# Patient Record
Sex: Female | Born: 1970
Health system: Southern US, Community
[De-identification: ages and names within clinical notes are randomized; demographics above are authoritative.]

## PROBLEM LIST (undated history)

## (undated) DIAGNOSIS — F419 Anxiety disorder, unspecified: Secondary | ICD-10-CM

## (undated) DIAGNOSIS — J310 Chronic rhinitis: Secondary | ICD-10-CM

## (undated) DIAGNOSIS — I10 Essential (primary) hypertension: Secondary | ICD-10-CM

## (undated) DIAGNOSIS — E785 Hyperlipidemia, unspecified: Secondary | ICD-10-CM

## (undated) DIAGNOSIS — R310 Gross hematuria: Secondary | ICD-10-CM

## (undated) DIAGNOSIS — J449 Chronic obstructive pulmonary disease, unspecified: Secondary | ICD-10-CM

## (undated) DIAGNOSIS — IMO0001 Reserved for inherently not codable concepts without codable children: Secondary | ICD-10-CM

## (undated) DIAGNOSIS — J45909 Unspecified asthma, uncomplicated: Secondary | ICD-10-CM

## (undated) DIAGNOSIS — N200 Calculus of kidney: Secondary | ICD-10-CM

## (undated) DIAGNOSIS — I359 Nonrheumatic aortic valve disorder, unspecified: Secondary | ICD-10-CM

## (undated) DIAGNOSIS — K219 Gastro-esophageal reflux disease without esophagitis: Secondary | ICD-10-CM

## (undated) DIAGNOSIS — N390 Urinary tract infection, site not specified: Secondary | ICD-10-CM

## (undated) DIAGNOSIS — M6110 Myositis ossificans progressiva, unspecified site: Secondary | ICD-10-CM

## (undated) DIAGNOSIS — R300 Dysuria: Secondary | ICD-10-CM

## (undated) DIAGNOSIS — G43909 Migraine, unspecified, not intractable, without status migrainosus: Secondary | ICD-10-CM

## (undated) DIAGNOSIS — D649 Anemia, unspecified: Secondary | ICD-10-CM

## (undated) HISTORY — DX: Calculus of kidney: N20.0

## (undated) HISTORY — DX: Migraine, unspecified, not intractable, without status migrainosus: G43.909

## (undated) HISTORY — DX: Reserved for inherently not codable concepts without codable children: IMO0001

## (undated) HISTORY — DX: Gross hematuria: R31.0

## (undated) HISTORY — PX: ABDOMINAL HYSTERECTOMY: SHX81

## (undated) HISTORY — DX: Dysuria: R30.0

## (undated) HISTORY — DX: Gastro-esophageal reflux disease without esophagitis: K21.9

## (undated) HISTORY — DX: Nonrheumatic aortic valve disorder, unspecified: I35.9

## (undated) HISTORY — PX: TUBAL LIGATION: SHX77

## (undated) HISTORY — DX: Myositis ossificans progressiva, unspecified site: M61.10

## (undated) HISTORY — DX: Unspecified asthma, uncomplicated: J45.909

## (undated) HISTORY — DX: Urinary tract infection, site not specified: N39.0

## (undated) HISTORY — DX: Chronic rhinitis: J31.0

---

## 2004-03-27 ENCOUNTER — Ambulatory Visit: Payer: Self-pay

## 2004-10-01 ENCOUNTER — Emergency Department: Payer: Self-pay | Admitting: Unknown Physician Specialty

## 2004-12-06 ENCOUNTER — Ambulatory Visit: Payer: Self-pay | Admitting: Internal Medicine

## 2004-12-09 ENCOUNTER — Ambulatory Visit: Payer: Self-pay | Admitting: Internal Medicine

## 2004-12-12 ENCOUNTER — Ambulatory Visit: Payer: Self-pay | Admitting: Obstetrics and Gynecology

## 2006-02-16 ENCOUNTER — Ambulatory Visit: Payer: Self-pay | Admitting: Obstetrics and Gynecology

## 2007-02-24 ENCOUNTER — Ambulatory Visit: Payer: Self-pay | Admitting: Internal Medicine

## 2007-06-25 ENCOUNTER — Ambulatory Visit: Payer: Self-pay | Admitting: Obstetrics and Gynecology

## 2007-07-02 ENCOUNTER — Ambulatory Visit: Payer: Self-pay | Admitting: Obstetrics and Gynecology

## 2011-03-14 DIAGNOSIS — R000 Tachycardia, unspecified: Secondary | ICD-10-CM | POA: Insufficient documentation

## 2011-03-14 DIAGNOSIS — I272 Pulmonary hypertension, unspecified: Secondary | ICD-10-CM | POA: Insufficient documentation

## 2011-05-20 ENCOUNTER — Ambulatory Visit: Payer: Self-pay | Admitting: Obstetrics and Gynecology

## 2011-05-27 ENCOUNTER — Ambulatory Visit: Payer: Self-pay | Admitting: Obstetrics and Gynecology

## 2012-02-04 HISTORY — PX: OTHER SURGICAL HISTORY: SHX169

## 2012-04-29 ENCOUNTER — Ambulatory Visit: Payer: Self-pay | Admitting: Podiatry

## 2012-06-08 ENCOUNTER — Ambulatory Visit: Payer: Self-pay | Admitting: Obstetrics and Gynecology

## 2012-12-22 ENCOUNTER — Ambulatory Visit: Payer: Self-pay | Admitting: Urology

## 2013-06-09 ENCOUNTER — Ambulatory Visit: Payer: Self-pay | Admitting: Obstetrics and Gynecology

## 2013-08-02 ENCOUNTER — Ambulatory Visit: Payer: Self-pay | Admitting: Obstetrics and Gynecology

## 2013-08-02 LAB — BASIC METABOLIC PANEL
ANION GAP: 10 (ref 7–16)
BUN: 11 mg/dL (ref 7–18)
Calcium, Total: 9.4 mg/dL (ref 8.5–10.1)
Chloride: 101 mmol/L (ref 98–107)
Co2: 28 mmol/L (ref 21–32)
Creatinine: 0.7 mg/dL (ref 0.60–1.30)
EGFR (African American): >60
EGFR (Non-African Amer.): >60
GLUCOSE: 90 mg/dL (ref 65–99)
Osmolality: 276 (ref 275–301)
POTASSIUM: 3.2 mmol/L — AB (ref 3.5–5.1)
SODIUM: 139 mmol/L (ref 136–145)

## 2013-08-02 LAB — CBC WITH DIFFERENTIAL/PLATELET
Basophil #: 0.1 10*3/uL (ref 0.0–0.1)
Basophil %: 0.6 %
EOS PCT: 1.2 %
Eosinophil #: 0.2 10*3/uL (ref 0.0–0.7)
HCT: 37.9 % (ref 35.0–47.0)
HGB: 13.1 g/dL (ref 12.0–16.0)
LYMPHS ABS: 2.5 10*3/uL (ref 1.0–3.6)
LYMPHS PCT: 17.8 %
MCH: 30.2 pg (ref 26.0–34.0)
MCHC: 34.6 g/dL (ref 32.0–36.0)
MCV: 87 fL (ref 80–100)
Monocyte #: 0.6 x10 3/mm (ref 0.2–0.9)
Monocyte %: 4.1 %
Neutrophil #: 10.7 10*3/uL — ABNORMAL HIGH (ref 1.4–6.5)
Neutrophil %: 76.3 %
Platelet: 524 10*3/uL — ABNORMAL HIGH (ref 150–440)
RBC: 4.35 10*6/uL (ref 3.80–5.20)
RDW: 13.9 % (ref 11.5–14.5)
WBC: 14 10*3/uL — ABNORMAL HIGH (ref 3.6–11.0)

## 2013-08-08 ENCOUNTER — Ambulatory Visit: Payer: Self-pay | Admitting: Obstetrics and Gynecology

## 2013-08-09 LAB — BASIC METABOLIC PANEL
Anion Gap: 7 (ref 7–16)
BUN: 6 mg/dL — ABNORMAL LOW (ref 7–18)
CALCIUM: 8.3 mg/dL — AB (ref 8.5–10.1)
CO2: 26 mmol/L (ref 21–32)
Chloride: 106 mmol/L (ref 98–107)
Creatinine: 0.67 mg/dL (ref 0.60–1.30)
EGFR (African American): >60
Glucose: 110 mg/dL — ABNORMAL HIGH (ref 65–99)
Osmolality: 276 (ref 275–301)
Potassium: 3.8 mmol/L (ref 3.5–5.1)
Sodium: 139 mmol/L (ref 136–145)

## 2013-08-09 LAB — HEMATOCRIT: HCT: 31.6 % — ABNORMAL LOW (ref 35.0–47.0)

## 2013-08-11 LAB — PATHOLOGY REPORT

## 2014-04-27 DIAGNOSIS — G43909 Migraine, unspecified, not intractable, without status migrainosus: Secondary | ICD-10-CM | POA: Insufficient documentation

## 2014-05-23 ENCOUNTER — Other Ambulatory Visit: Payer: Self-pay | Admitting: Obstetrics and Gynecology

## 2014-05-23 DIAGNOSIS — E785 Hyperlipidemia, unspecified: Secondary | ICD-10-CM | POA: Insufficient documentation

## 2014-05-23 DIAGNOSIS — Z1231 Encounter for screening mammogram for malignant neoplasm of breast: Secondary | ICD-10-CM

## 2014-05-23 DIAGNOSIS — E559 Vitamin D deficiency, unspecified: Secondary | ICD-10-CM | POA: Insufficient documentation

## 2014-05-27 NOTE — Op Note (Signed)
PATIENT NAME:  Kristen Mcmahon, Kristen Mcmahon MR#:  [id number] DATE OF BIRTH:  1970/09/30  DATE OF PROCEDURE:  08/08/2013  PREOPERATIVE DIAGNOSES:  1. Symptomatic fibroid uterus with menorrhagia.  2. Failed NovaSure endometrial ablation.   POSTOPERATIVE DIAGNOSES: 1. Symptomatic fibroid uterus with menorrhagia.  2. Failed NovaSure endometrial ablation.   PROCEDURES:  1. Laparoscopic supracervical hysterectomy.  2. Left salpingectomy.   ANESTHESIA: General endotracheal anesthesia.  SURGEON: Dr. Ouida Sills.    INDICATIONS: This is a 44 year old female status post NovaSure ablation with continued heavy menorrhagia. The patient is noted to have multiple fibroids on recent ultrasound, largest fibroid measuring 5 x 5 cm. The patient had at a negative endometrial biopsy prior to this procedure. The patient is fully counseled regarding the risk of leiomyosarcoma and morcellator use intra-abdominally. All consents have been signed.   PROCEDURE: After adequate general endotracheal anesthesia, the patient was placed in the dorsal supine position, legs placed in the [place] stirrups. SCDs on the lower extremities. The patient received 2 grams IV cefoxitin prior to commencement of the case. The patient's abdomen, perineum, and vagina were prepped and draped in a normal sterile fashion. A single-tooth tenaculum was placed on the anterior cervix and the uterus sound was kept in the endometrial cavity to be used for uterine manipulation during the procedure. These were anchored together with Steri-Strips. Gloves were changed. A 27-XA infraumbilical incision was made after injecting with 0.5% Marcaine. The laparoscope was advanced into the abdominal cavity under direct visualization with the Optiview cannula. The patient's abdomen was insufflated. A #11-mm port was advanced <<into>> the left lower quadrant 3 cm medial to the left anterior iliac spine. Under direct visualization, the port was placed. A similar procedure was  repeated on the patient's right, again 3 cm medial to the right anterior iliac spine and the port was placed under direct visualization.   INITIAL FINDINGS: A large bulbous fibroid uterus with ovaries tucked very deep into the pelvis. A single-tooth tenaculum was brought up and placed at the left cornua and placed on right medial traction. The round ligament was cauterized and opened with the Harmonic scalpel. A Kleppinger used to cauterize the ovarian uterine ligament and then this ligament was opened with the Harmonic scalpel. Serial sequential bites to the broad ligament were performed on the left. Ultimately, the left uterine artery was cauterized with the Kleppingers and then transected with the Harmonic scalpel. A bladder flap was created prior to transecting the left uterine artery. A similar procedure was repeated on the patient's right side, again placing the single-tooth tenaculum on the right fundal area with traction placed laterally to the left. A round ligament was cauterized and transected. The uterine ovarian ligament was cauterized and transected and sequential bites were taken in the right broad ligament. Ultimately, the right uterine artery was cauterized and transected. At the level of the uterosacral ligaments then the cervix was transected with the Harmonic scalpel. The uterine sound was removed to allow for finalization of excision of the cervix. The uterus was placed cephalad and the Kleppingers were then placed in the cervical stump and the cervix and cauterized. This was aided with a vaginal hand during this procedure. Attention was then directed to the patient's left fallopian tube which was grasped with a grasper and the left salpingectomy was performed with the Harmonic scalpel. The patient's right fallopian tube was scarred to the posterior wall of the posterior lateral wall of the ovary and was not free; therefore, extensive dissection was not performed  to try to free this since this  was a prophylactic procedure. The ovaries appeared normal. The hand-held battery-operated morcellator was brought up to the operative field. Company representative was intraoperative and answered any uncertain questions. The morcellation of the uterus was performed in a standard fashion and all remnants of uterus were retrieved with pickups. The patient's abdomen was copiously irrigated and given some excessive charring effect on the left lateral aspect of the uterine artery <<arista>> was applied to this area to be used as a coagulant. Pressure was lowered to 7 mmHg and good hemostasis was noted. The upper abdomen appeared normal. Given the lateral dissection that was performed during this procedure, surgeon thought it was prudent to do a cystoscopy to ensure no ureteral injury. Intravascular methylene blue was administered while performing cystoscopy. Ureteral ostia were noted bilaterally with normal pluming effect with methylene blue coming from each orifice. A Foley catheter was then replaced given it was placed at the beginning of the case. Gloves were changed again and all instruments were removed from the patient's abdomen. The infraumbilical and the left lower quadrant port sites were closed with a deep fascial layer, 2-0 Vicryl suture, and all incisions were closed with 4-0 interrupted Vicryl suture. Steri-Strips were applied and Tegaderm was placed. There were no complications.   ESTIMATED BLOOD LOSS: 300 mL.  INTRAOPERATIVE FLUIDS: 1400 mL.  URINE OUTPUT: Approximately 150 mL at the commencement of the case.   The patient tolerated the procedure well and was taken to the recovery room in good condition.    ____________________________ Boykin Nearing, MD tjs:lt D: 08/08/2013 12:25:10 ET T: 08/08/2013 23:50:00 ET JOB#: [id number]  cc: Boykin Nearing, MD, <Dictator> Boykin Nearing MD ELECTRONICALLY SIGNED 08/12/2013 7:59

## 2014-06-13 ENCOUNTER — Ambulatory Visit
Admission: RE | Admit: 2014-06-13 | Discharge: 2014-06-13 | Disposition: A | Discharge status: Home / Self Care | Payer: 59 | Source: Ambulatory Visit | Attending: Obstetrics and Gynecology | Admitting: Obstetrics and Gynecology

## 2014-06-13 DIAGNOSIS — Z1231 Encounter for screening mammogram for malignant neoplasm of breast: Secondary | ICD-10-CM | POA: Insufficient documentation

## 2014-11-01 ENCOUNTER — Ambulatory Visit (INDEPENDENT_AMBULATORY_CARE_PROVIDER_SITE_OTHER): Payer: 59 | Admitting: Urology

## 2014-11-01 ENCOUNTER — Other Ambulatory Visit: Payer: Self-pay | Admitting: *Deleted

## 2014-11-01 ENCOUNTER — Encounter: Payer: Self-pay | Admitting: *Deleted

## 2014-11-01 VITALS — BP 142/85 | HR 116 | Temp 97.6°F | Ht 65.0 in | Wt 214.4 lb

## 2014-11-01 DIAGNOSIS — R300 Dysuria: Secondary | ICD-10-CM | POA: Diagnosis not present

## 2014-11-01 DIAGNOSIS — Z87448 Personal history of other diseases of urinary system: Secondary | ICD-10-CM

## 2014-11-01 MED ORDER — CEPHALEXIN 500 MG PO CAPS: 500.0000 mg | ORAL_CAPSULE | Freq: Four times a day (QID) | ORAL | Status: DC

## 2014-11-01 MED ORDER — URIBEL 118 MG PO CAPS: 1.0000 | ORAL_CAPSULE | Freq: Four times a day (QID) | ORAL | Status: DC

## 2014-11-01 NOTE — Progress Notes (Signed)
11/01/2014 12:09 PM   Kristen Mcmahon April 13, 1970 [id number]  Referring provider: Tracie Harrier, MD [place], [place] [address]  Chief Complaint  Patient presents with  . Dysuria    patient thinks she has an UTI x 3 days    HPI: Patient is a 44 year old white female who presents today after having 3 days of urinary tract infection symptoms. She states she has experienced frequent urination, dysuria, nocturia, chills, suprapubic pain and urgency. She denied any gross hematuria, fevers, nausea or vomiting.    She denies at this infection was precipitated by sexual intercourse. She does suffer from intermittent constipation, but she does not complain of diarrhea.  She states she feels she is not completely emptying her bladder, but she denies any leakage.  Her UA today is suspicious for infection.    PMH: Past Medical History  Diagnosis Date  . Migraines   . Reflux   . Rhinitis   . Dysuria   . Asthma   . Kidney stones   . Aortic valve disorder   . UTI (lower urinary tract infection)   . Gross hematuria     Surgical History: Past Surgical History  Procedure Laterality Date  . Hallux rigidus of left foot  2014  . Abdominal hysterectomy      Home Medications:    Medication List       This list is accurate as of: 11/01/14 12:09 PM.  Always use your most recent med list.               ADVAIR DISKUS 250-50 MCG/DOSE Aepb  Generic drug:  Fluticasone-Salmeterol  1 puff bid     baclofen 10 MG tablet  Commonly known as:  LIORESAL  Take by mouth.     cephALEXin 500 MG capsule  Commonly known as:  KEFLEX  Take 1 capsule (500 mg total) by mouth 4 (four) times daily.     COMBIVENT 18-103 MCG/ACT inhaler  Generic drug:  albuterol-ipratropium  Inhale into the lungs.     lamoTRIgine 200 MG tablet  Commonly known as:  LAMICTAL  Take by mouth.     losartan-hydrochlorothiazide 50-12.5 MG tablet  Commonly known as:  HYZAAR     montelukast 10 MG  tablet  Commonly known as:  SINGULAIR  Take by mouth.     Omega-3 1000 MG Caps  Take by mouth.     promethazine 50 MG tablet  Commonly known as:  PHENERGAN     RESTASIS 0.05 % ophthalmic emulsion  Generic drug:  cycloSPORINE  Apply to eye.     simvastatin 20 MG tablet  Commonly known as:  ZOCOR  Take by mouth.     SUMAtriptan 100 MG tablet  Commonly known as:  IMITREX  Take by mouth.     traMADol 50 MG tablet  Commonly known as:  ULTRAM  Take by mouth.     URIBEL 118 MG Caps  Take 1 capsule (118 mg total) by mouth 4 (four) times daily.     Vitamin D (Ergocalciferol) 50000 UNITS Caps capsule  Commonly known as:  DRISDOL  Take by mouth.        Allergies:  Allergies  Allergen Reactions  . Sulfa Antibiotics Rash  . Tetracycline Other (See Comments)    Caused migraines    Family History: Family History  Problem Relation Age of Onset  . Bladder Cancer Father   . Diabetes Mellitus II Father   . Kidney disease Neg Hx   .  Prostate cancer Father     Social History:  reports that she has quit smoking. She does not have any smokeless tobacco history on file. She reports that she drinks alcohol. She reports that she does not use illicit drugs.  ROS: UROLOGY Frequent Urination?: No Hard to postpone urination?: No Burning/pain with urination?: Yes Get up at night to urinate?: Yes Leakage of urine?: No Urine stream starts and stops?: No Trouble starting stream?: No Do you have to strain to urinate?: No Blood in urine?: No Urinary tract infection?: Yes Sexually transmitted disease?: No Injury to kidneys or bladder?: No Painful intercourse?: No Weak stream?: No Currently pregnant?: No Vaginal bleeding?: No Last menstrual period?: n  Gastrointestinal Nausea?: No Vomiting?: No Indigestion/heartburn?: No Diarrhea?: No Constipation?: No  Constitutional Fever: No Night sweats?: No Weight loss?: No Fatigue?: No  Skin Skin rash/lesions?: No Itching?:  No  Eyes Blurred vision?: No Double vision?: No  Ears/Nose/Throat Sore throat?: No Sinus problems?: No  Hematologic/Lymphatic Swollen glands?: No Easy bruising?: No  Cardiovascular Leg swelling?: No Chest pain?: No  Respiratory Cough?: No Shortness of breath?: No  Endocrine Excessive thirst?: No  Musculoskeletal Back pain?: No Joint pain?: No  Neurological Headaches?: No Dizziness?: No  Psychologic Depression?: No Anxiety?: No  Physical Exam: BP 142/85 mmHg  Pulse 116  Temp(Src) 97.6 F (36.4 C) (Oral)  Ht 5\' 5"  (1.651 m)  Wt 214 lb 6.4 oz (97.251 kg)  BMI 35.68 kg/m2    Laboratory Data: Lab Results  Component Value Date   WBC 14.0* 08/02/2013   HGB 13.1 08/02/2013   HCT 31.6* 08/09/2013   MCV 87 08/02/2013   PLT 524* 08/02/2013    Lab Results  Component Value Date   CREATININE 0.67 08/09/2013   Urinalysis Results for orders placed or performed in visit on 11/01/14  CULTURE, URINE COMPREHENSIVE  Result Value Ref Range   Urine Culture, Comprehensive Final report    Result 1 Comment   Microscopic Examination  Result Value Ref Range   WBC, UA 6-10 (A) 0 -  5 /hpf   RBC, UA 0-2 0 -  2 /hpf   Epithelial Cells (non renal) 0-10 0 - 10 /hpf   Bacteria, UA Moderate (A) None seen/Few  Urinalysis, Complete  Result Value Ref Range   Specific Gravity, UA 1.010 1.005 - 1.030   pH, UA 7.5 5.0 - 7.5   Color, UA Yellow Yellow   Appearance Ur Clear Clear   Leukocytes, UA Trace (A) Negative   Protein, UA Negative Negative/Trace   Glucose, UA Negative Negative   Ketones, UA Negative Negative   RBC, UA 2+ (A) Negative   Bilirubin, UA Negative Negative   Urobilinogen, Ur 0.2 0.2 - 1.0 mg/dL   Nitrite, UA Negative Negative   Microscopic Examination See below:     Assessment & Plan:   1. Dysuria:   Patient's UA is suspicious for infection. I will send it for culture. I have empirically started her on Keflex 500 mg twice a day until cultures and  sensitivities are available.  I have prescribed Uribel for the dysuria to take 4 times daily with 8 ounces of water. She will follow-up in 2 weeks' time.     - Urinalysis, Complete - CULTURE, URINE COMPREHENSIVE  2. History of hematuria:   Patient underwent a CT urogram in 2014 for an episode of gross hematuria. She had a mild right UPJ obstruction noted which appeared chronic. She did not keep her cystoscopy appointment.  She denied any recent episodes of gross hematuria. When she returns in 2 weeks for follow-up,  I will reschedule her cystoscopy appointment.    3. Mild chronic right UPJ obstruction:   Noted on a CT Urogram in 2014.  I will schedule a RUS when she returns in 2 weeks.     Return in about 2 weeks (around 11/15/2014) for UA and office visit.  Zara Council, [facility] [address], [place], [place] [address] [phone]

## 2014-11-03 ENCOUNTER — Telehealth: Payer: Self-pay

## 2014-11-03 LAB — CULTURE, URINE COMPREHENSIVE

## 2014-11-03 NOTE — Telephone Encounter (Signed)
-----   Message from Nori Riis, PA-C sent at 11/03/2014  8:38 AM EDT ----- Patient's urine culture results are inconclusive. When she returns in 2 weeks, we'll obtain a cath specimen at that time.

## 2014-11-03 NOTE — Telephone Encounter (Signed)
Pt returned the call. Made aware of cx results. Pt voiced understanding.

## 2014-11-03 NOTE — Telephone Encounter (Signed)
LMOM

## 2014-11-06 LAB — URINALYSIS, COMPLETE
BILIRUBIN UA: NEGATIVE
GLUCOSE, UA: NEGATIVE
KETONES UA: NEGATIVE
NITRITE UA: NEGATIVE
Protein, UA: NEGATIVE
SPEC GRAV UA: 1.01 (ref 1.005–1.030)
UUROB: 0.2 mg/dL (ref 0.2–1.0)
pH, UA: 7.5 (ref 5.0–7.5)

## 2014-11-06 LAB — MICROSCOPIC EXAMINATION

## 2014-11-22 ENCOUNTER — Encounter: Payer: Self-pay | Admitting: Urology

## 2014-11-22 ENCOUNTER — Ambulatory Visit (INDEPENDENT_AMBULATORY_CARE_PROVIDER_SITE_OTHER): Payer: 59 | Admitting: Urology

## 2014-11-22 VITALS — BP 144/94 | HR 114 | Ht 65.0 in | Wt 212.6 lb

## 2014-11-22 DIAGNOSIS — R300 Dysuria: Secondary | ICD-10-CM | POA: Diagnosis not present

## 2014-11-22 DIAGNOSIS — B373 Candidiasis of vulva and vagina: Secondary | ICD-10-CM

## 2014-11-22 DIAGNOSIS — B3731 Acute candidiasis of vulva and vagina: Secondary | ICD-10-CM | POA: Insufficient documentation

## 2014-11-22 DIAGNOSIS — R3129 Other microscopic hematuria: Secondary | ICD-10-CM | POA: Insufficient documentation

## 2014-11-22 DIAGNOSIS — N135 Crossing vessel and stricture of ureter without hydronephrosis: Secondary | ICD-10-CM

## 2014-11-22 LAB — URINALYSIS, COMPLETE
BILIRUBIN UA: NEGATIVE
Glucose, UA: NEGATIVE
Leukocytes, UA: NEGATIVE
Nitrite, UA: NEGATIVE
PH UA: 6.5 (ref 5.0–7.5)
Specific Gravity, UA: 1.015 (ref 1.005–1.030)
UUROB: 0.2 mg/dL (ref 0.2–1.0)

## 2014-11-22 LAB — MICROSCOPIC EXAMINATION
RENAL EPITHEL UA: NONE SEEN /HPF
WBC UA: NONE SEEN /HPF (ref 0–?)

## 2014-11-22 MED ORDER — FLUCONAZOLE 150 MG PO TABS: 150.0000 mg | ORAL_TABLET | Freq: Once | ORAL | Status: DC

## 2014-11-22 MED ORDER — UROGESIC-BLUE 81.6 MG PO TABS: 1.0000 | ORAL_TABLET | Freq: Four times a day (QID) | ORAL | Status: DC

## 2014-11-22 NOTE — Patient Instructions (Signed)
Do not take the simvastatin the days you take the diflucan

## 2014-11-22 NOTE — Progress Notes (Signed)
3:29 PM   Kristen Mcmahon 1970-10-07 [id number]  Referring provider: Tracie Harrier, MD [address] [facility] [place], [place] [address]  Chief Complaint  Patient presents with  . Hematuria    2 week followup   . Mild right UPJ obstruction    HPI: Patient presents today for a 2 week follow-up for urinary tract infection.  Patient was started empirically on Keflex and her culture returned as were the 3 organisms recovered. Her symptoms of frequency, dysuria, nocturia, chills, suprapubic pain and urgency have abated.  She is now complaining of a vaginal discharge and vaginal itching.  She is not having any gross hematuria or flank pain at this time.  She stated the Uribel has given her shakes and would like a different medication to take for her urinary symptoms.  When reviewing her records, she had not had a cystoscopic examination to complete her hematuria workup.  She also found to have a mild chronic right UPJ obstruction on the CT urogram completed 2014. We will schedule renal ultrasound to monitor this condition.   PMH: Past Medical History  Diagnosis Date  . Migraines   . Reflux   . Rhinitis   . Dysuria   . Asthma   . Kidney stones   . Aortic valve disorder   . UTI (lower urinary tract infection)   . Gross hematuria     Surgical History: Past Surgical History  Procedure Laterality Date  . Hallux rigidus of left foot  2014  . Abdominal hysterectomy      Home Medications:    Medication List       This list is accurate as of: 11/22/14  3:29 PM.  Always use your most recent med list.               ADVAIR DISKUS 250-50 MCG/DOSE Aepb  Generic drug:  Fluticasone-Salmeterol  1 puff bid     baclofen 10 MG tablet  Commonly known as:  LIORESAL  Take by mouth.     cephALEXin 500 MG capsule  Commonly known as:  KEFLEX  Take 1 capsule (500 mg total) by mouth 4 (four) times daily.     COMBIVENT 18-103 MCG/ACT inhaler  Generic drug:   albuterol-ipratropium  Inhale into the lungs.     fluconazole 150 MG tablet  Commonly known as:  DIFLUCAN  Take 1 tablet (150 mg total) by mouth once.     lamoTRIgine 200 MG tablet  Commonly known as:  LAMICTAL  Take by mouth.     losartan-hydrochlorothiazide 50-12.5 MG tablet  Commonly known as:  HYZAAR     montelukast 10 MG tablet  Commonly known as:  SINGULAIR  Take by mouth.     Omega-3 1000 MG Caps  Take by mouth.     promethazine 50 MG tablet  Commonly known as:  PHENERGAN     RESTASIS 0.05 % ophthalmic emulsion  Generic drug:  cycloSPORINE  Apply to eye.     simvastatin 20 MG tablet  Commonly known as:  ZOCOR  Take by mouth.     SUMAtriptan 100 MG tablet  Commonly known as:  IMITREX  Take by mouth.     traMADol 50 MG tablet  Commonly known as:  ULTRAM  Take by mouth.     URIBEL 118 MG Caps  Take 1 capsule (118 mg total) by mouth 4 (four) times daily.     UROGESIC-BLUE 81.6 MG Tabs  Take 1 tablet (81.6 mg  total) by mouth QID.     Vitamin D (Ergocalciferol) 50000 UNITS Caps capsule  Commonly known as:  DRISDOL  Take by mouth.        Allergies:  Allergies  Allergen Reactions  . Sulfa Antibiotics Rash  . Tetracycline Other (See Comments)    Caused migraines    Family History: Family History  Problem Relation Age of Onset  . Bladder Cancer Father   . Diabetes Mellitus II Father   . Kidney disease Neg Hx   . Prostate cancer Father     Social History:  reports that she has quit smoking. She does not have any smokeless tobacco history on file. She reports that she drinks alcohol. She reports that she does not use illicit drugs.  ROS: UROLOGY Frequent Urination?: No Hard to postpone urination?: No Burning/pain with urination?: Yes Get up at night to urinate?: No Leakage of urine?: No Urine stream starts and stops?: No Trouble starting stream?: No Do you have to strain to urinate?: Yes Blood in urine?: Yes Urinary tract infection?:  Yes Sexually transmitted disease?: No Injury to kidneys or bladder?: No Painful intercourse?: No Weak stream?: No Currently pregnant?: No Vaginal bleeding?: No Last menstrual period?: n  Gastrointestinal Nausea?: No Vomiting?: No Indigestion/heartburn?: No Diarrhea?: No Constipation?: No  Constitutional Fever: No Night sweats?: No Weight loss?: No Fatigue?: Yes  Skin Skin rash/lesions?: No Itching?: No  Eyes Blurred vision?: No Double vision?: No  Ears/Nose/Throat Sore throat?: No Sinus problems?: No  Hematologic/Lymphatic Swollen glands?: No Easy bruising?: No  Cardiovascular Leg swelling?: No Chest pain?: No  Respiratory Cough?: No Shortness of breath?: No  Endocrine Excessive thirst?: No  Musculoskeletal Back pain?: No Joint pain?: No  Neurological Headaches?: No Dizziness?: No  Psychologic Depression?: No Anxiety?: No  Physical Exam: Blood pressure 144/94, pulse 114, height 5\' 5"  (1.651 m), weight 212 lb 9.6 oz (96.435 kg).   Laboratory Data:  Urinalysis: Results for orders placed or performed in visit on 11/22/14  Microscopic Examination  Result Value Ref Range   WBC, UA None seen 0 -  5 /hpf   RBC, UA 3-10 (A) 0 -  2 /hpf   Epithelial Cells (non renal) >10 (A) 0 - 10 /hpf   Renal Epithel, UA None seen None seen /hpf   Bacteria, UA Moderate (A) None seen/Few   Yeast, UA Present (A) None seen  Urinalysis, Complete  Result Value Ref Range   Specific Gravity, UA 1.015 1.005 - 1.030   pH, UA 6.5 5.0 - 7.5   Color, UA Green (A) Yellow   Appearance Ur Cloudy (A) Clear   Leukocytes, UA Negative Negative   Protein, UA Trace (A) Negative/Trace   Glucose, UA Negative Negative   Ketones, UA 3+ (A) Negative   RBC, UA 2+ (A) Negative   Bilirubin, UA Negative Negative   Urobilinogen, Ur 0.2 0.2 - 1.0 mg/dL   Nitrite, UA Negative Negative   Microscopic Examination See below:     Assessment & Plan:   1. Dysuria:   Resolved.  I have  prescribed Urogesic Blue for her to have on hand for symptoms of UTI's.   - Urinalysis, Complete  2. Microscopic hematuria:   Patient underwent a CT urogram in 2014 for an episode of gross hematuria. She had a mild right UPJ obstruction noted which appeared chronic. She did not keep her cystoscopy appointment.  I will reschedule her cystoscopy appointment.    3. Mild chronic right UPJ obstruction:   Noted  on a CT Urogram in 2014.  I will schedule a RUS.    4. Vaginal yeast infection:   Patient was prescribed Diflucan tablets 150 mg for 3 days.    I advised her not to take her simvastatin on the day she takes her Diflucan tablets.     Return for cystoscopy.  Zara Council, [facility] [address] [place], [place] [address] [phone]

## 2014-12-04 ENCOUNTER — Ambulatory Visit
Admission: RE | Admit: 2014-12-04 | Discharge: 2014-12-04 | Disposition: A | Discharge status: Home / Self Care | Payer: 59 | Source: Ambulatory Visit | Attending: Urology | Admitting: Urology

## 2014-12-04 DIAGNOSIS — N135 Crossing vessel and stricture of ureter without hydronephrosis: Secondary | ICD-10-CM

## 2014-12-14 ENCOUNTER — Ambulatory Visit (INDEPENDENT_AMBULATORY_CARE_PROVIDER_SITE_OTHER): Payer: 59 | Admitting: Urology

## 2014-12-14 VITALS — BP 116/79 | HR 91 | Ht 65.0 in | Wt 212.8 lb

## 2014-12-14 DIAGNOSIS — B3731 Acute candidiasis of vulva and vagina: Secondary | ICD-10-CM

## 2014-12-14 DIAGNOSIS — R3129 Other microscopic hematuria: Secondary | ICD-10-CM | POA: Diagnosis not present

## 2014-12-14 DIAGNOSIS — B373 Candidiasis of vulva and vagina: Secondary | ICD-10-CM

## 2014-12-14 DIAGNOSIS — K219 Gastro-esophageal reflux disease without esophagitis: Secondary | ICD-10-CM | POA: Insufficient documentation

## 2014-12-14 DIAGNOSIS — N135 Crossing vessel and stricture of ureter without hydronephrosis: Secondary | ICD-10-CM

## 2014-12-14 DIAGNOSIS — R0602 Shortness of breath: Secondary | ICD-10-CM | POA: Insufficient documentation

## 2014-12-14 DIAGNOSIS — R768 Other specified abnormal immunological findings in serum: Secondary | ICD-10-CM | POA: Insufficient documentation

## 2014-12-14 DIAGNOSIS — J45909 Unspecified asthma, uncomplicated: Secondary | ICD-10-CM | POA: Insufficient documentation

## 2014-12-14 LAB — URINALYSIS, COMPLETE
BILIRUBIN UA: NEGATIVE
Glucose, UA: NEGATIVE
LEUKOCYTES UA: NEGATIVE
Nitrite, UA: NEGATIVE
PH UA: 7 (ref 5.0–7.5)
PROTEIN UA: NEGATIVE
Specific Gravity, UA: 1.015 (ref 1.005–1.030)
Urobilinogen, Ur: 0.2 mg/dL (ref 0.2–1.0)

## 2014-12-14 LAB — MICROSCOPIC EXAMINATION: WBC UA: NONE SEEN /HPF (ref 0–?)

## 2014-12-14 MED ORDER — LIDOCAINE HCL 2 % EX GEL
1.0000 "application " | Freq: Once | CUTANEOUS | Status: AC
  Administered 2014-12-14: 1 via URETHRAL

## 2014-12-14 MED ORDER — FLUCONAZOLE 150 MG PO TABS: 150.0000 mg | ORAL_TABLET | Freq: Once | ORAL | Status: DC

## 2014-12-14 MED ORDER — CIPROFLOXACIN HCL 500 MG PO TABS
500.0000 mg | ORAL_TABLET | Freq: Once | ORAL | Status: AC
  Administered 2014-12-14: 500 mg via ORAL

## 2014-12-14 NOTE — Progress Notes (Signed)
12/14/2014 12:03 PM   Kristen Mcmahon [birth year] [id number]  Referring provider: Tracie Harrier, MD [address] [facility] [place], [place] [address]  Chief Complaint  Patient presents with  . Cysto    HPI: 44 yo F with history of gross/ microscopic hematuria s/p work up 2014 including CTU but never complete cystoscopy.  She was found to have a mild chronic right UPJ obstruction (asymptomatic) on CT Urogram at the time.  She returns to the office today for cystoscopy/ RUS given persistent microscopic hematuria and no previous cysto.     Renal ultrasound 12/04/14 negative for any pathology including hydronephrosis.  She was treated for urinary tract infection a few months ago and has had several yeast infection since. Today she complains of external burning.  PMH: Past Medical History  Diagnosis Date  . Migraines   . Reflux   . Rhinitis   . Dysuria   . Asthma   . Kidney stones   . Aortic valve disorder   . UTI (lower urinary tract infection)   . Gross hematuria     Surgical History: Past Surgical History  Procedure Laterality Date  . Hallux rigidus of left foot  2014  . Abdominal hysterectomy      Home Medications:    Medication List       This list is accurate as of: 12/14/14 12:03 PM.  Always use your most recent med list.               ADVAIR DISKUS 250-50 MCG/DOSE Aepb  Generic drug:  Fluticasone-Salmeterol  1 puff bid     baclofen 10 MG tablet  Commonly known as:  LIORESAL  Take by mouth.     COMBIVENT 18-103 MCG/ACT inhaler  Generic drug:  albuterol-ipratropium  Inhale into the lungs.     fluconazole 150 MG tablet  Commonly known as:  DIFLUCAN  Take 1 tablet (150 mg total) by mouth once.     lamoTRIgine 200 MG tablet  Commonly known as:  LAMICTAL  Take by mouth.     losartan-hydrochlorothiazide 50-12.5 MG tablet  Commonly known as:  HYZAAR     montelukast 10 MG tablet  Commonly known as:  SINGULAIR  Take by mouth.      Omega-3 1000 MG Caps  Take by mouth.     promethazine 50 MG tablet  Commonly known as:  PHENERGAN     RESTASIS 0.05 % ophthalmic emulsion  Generic drug:  cycloSPORINE  Apply to eye.     simvastatin 20 MG tablet  Commonly known as:  ZOCOR  Take by mouth.     SUMAtriptan 100 MG tablet  Commonly known as:  IMITREX  Take by mouth.     traMADol 50 MG tablet  Commonly known as:  ULTRAM  Take by mouth.     URIBEL 118 MG Caps  Take 1 capsule (118 mg total) by mouth 4 (four) times daily.     UROGESIC-BLUE 81.6 MG Tabs  Take 1 tablet (81.6 mg total) by mouth QID.     Vitamin D (Ergocalciferol) 50000 UNITS Caps capsule  Commonly known as:  DRISDOL  Take by mouth.        Allergies:  Allergies  Allergen Reactions  . Sulfa Antibiotics Rash  . Tetracycline Other (See Comments)    Caused migraines    Family History: Family History  Problem Relation Age of Onset  . Bladder Cancer Father   . Diabetes Mellitus II Father   .  Kidney disease Neg Hx   . Prostate cancer Father     Social History:  reports that she has quit smoking. She does not have any smokeless tobacco history on file. She reports that she drinks alcohol. She reports that she does not use illicit drugs.   Physical Exam: BP 116/79 mmHg  Pulse 91  Ht 5\' 5"  (1.651 m)  Wt 212 lb 12.8 oz (96.525 kg)  BMI 35.41 kg/m2  Constitutional:  Alert and oriented, No acute distress. HEENT: [place] AT, moist mucus membranes.  Trachea midline, no masses. Cardiovascular: No clubbing, cyanosis, or edema. Respiratory: Normal respiratory effort, no increased work of breathing. GI: Abdomen is soft, nontender, nondistended, no abdominal masses GU: No CVA tenderness. Normal urethral meatus.  Erythema of internal surface of labia consistent with possible candidiasis.  No vaginal discharge. Skin: No rashes, bruises or suspicious lesions. Neurologic: Grossly intact, no focal deficits, moving all 4 extremities. Psychiatric: Normal  mood and affect.  Laboratory Data: Lab Results  Component Value Date   WBC 14.0* 08/02/2013   HGB 13.1 08/02/2013   HCT 31.6* 08/09/2013   MCV 87 08/02/2013   PLT 524* 08/02/2013    Lab Results  Component Value Date   CREATININE 0.67 08/09/2013    Urinalysis    Component Value Date/Time   GLUCOSEU Negative 11/22/2014 1508   BILIRUBINUR Negative 11/22/2014 1508   NITRITE Negative 11/22/2014 1508   LEUKOCYTESUR Negative 11/22/2014 1508    Pertinent Imaging: RUS 12/04/14  CLINICAL DATA: UPJ obstruction, recurrent UTIs  EXAM: RENAL / URINARY TRACT ULTRASOUND COMPLETE  COMPARISON: 12/22/2012  FINDINGS: Right Kidney:  Length: 12.8 cm. Echogenicity within normal limits. No mass or hydronephrosis visualized.  Left Kidney:  Length: 11.7 cm. Echogenicity within normal limits. No mass or hydronephrosis visualized.  Bladder:  Appears normal for degree of bladder distention.  IMPRESSION: Normal exam. No evidence for hydronephrosis.   Electronically Signed  By: Kerby Moors M.D.  On: 12/04/2014 13:15   Cystoscopy Procedure Note  Patient identification was confirmed, informed consent was obtained, and patient was prepped using Betadine solution.  Lidocaine jelly was administered per urethral meatus.    Preoperative abx where received prior to procedure.    Procedure: - Flexible cystoscope introduced, without any difficulty.   - Thorough search of the bladder revealed:    normal urethral meatus    normal urothelium    no stones    no ulcers     no tumors    no urethral polyps    no trabeculation  - Ureteral orifices were normal in position and appearance.  Post-Procedure: - Patient tolerated the procedure well  Assessment & Plan:  1. Microscopic hematuria Status post normal cystoscopy today. Repeat renal ultrasound negative for any obvious pathology. - Urinalysis, Complete - ciprofloxacin (CIPRO) tablet 500 mg; Take 1 tablet  (500 mg total) by mouth once. - lidocaine (XYLOCAINE) 2 % jelly 1 application; Place 1 application into the urethra once.  2. UPJ (ureteropelvic junction) obstruction Incidental mild right UPJ on CT Urogram 2014.  Asymptomatic.   No evidence of hydronephrosis or cortical thinning on renal ultrasound 11/2014.  Asymptomatic.  Will continue to follow infrequently.  3.   Vaginal yeast infection - Treat with fluconazole   Return in about 2 years (around 12/13/2016) for with [facility] for UA/ RUS.  Hollice Espy, MD  [facility] [address], [place], [place] [address] [phone]

## 2014-12-14 NOTE — Patient Instructions (Signed)
Cystoscopy  Cystoscopy is a procedure that is used to help your caregiver diagnose and sometimes treat conditions that affect your lower urinary tract. Your lower urinary tract includes your bladder and the tube through which urine passes from your bladder out of your body (urethra). Cystoscopy is performed with a thin, tube-shaped instrument (cystoscope). The cystoscope has lenses and a light at the end so that your caregiver can see inside your bladder. The cystoscope is inserted at the entrance of your urethra. Your caregiver guides it through your urethra and into your bladder. There are two main types of cystoscopy:  · Flexible cystoscopy (with a flexible cystoscope).  · Rigid cystoscopy (with a rigid cystoscope).  Cystoscopy may be recommended for many conditions, including:  · Urinary tract infections.  · Blood in your urine (hematuria).  · Loss of bladder control (urinary incontinence) or overactive bladder.  · Unusual cells found in a urine sample.  · Urinary blockage.  · Painful urination.  Cystoscopy may also be done to remove a sample of your tissue to be checked under a microscope (biopsy). It may also be done to remove or destroy bladder stones.  LET YOUR CAREGIVER KNOW ABOUT:  · Allergies to food or medicine.  · Medicines taken, including vitamins, herbs, eyedrops, over-the-counter medicines, and creams.  · Use of steroids (by mouth or creams).  · Previous problems with anesthetics or numbing medicines.  · History of bleeding problems or blood clots.  · Previous surgery.  · Other health problems, including diabetes and kidney problems.  · Possibility of pregnancy, if this applies.  PROCEDURE  The area around the opening to your urethra will be cleaned. A medicine to numb your urethra (local anesthetic) is used. If a tissue sample or stone is removed during the procedure, you may be given a medicine to make you sleep (general anesthetic).  Your caregiver will gently insert the tip of the cystoscope  into your urethra. The cystoscope will be slowly glided through your urethra and into your bladder. Sterile fluid will flow through the cystoscope and into your bladder. The fluid will expand and stretch your bladder. This gives your caregiver a better view of your bladder walls. The procedure lasts about 15-20 minutes.  AFTER THE PROCEDURE  If a local anesthetic is used, you will be allowed to go home as soon as you are ready. If a general anesthetic is used, you will be taken to a recovery area until you are stable. You may have temporary bleeding and burning on urination.     This information is not intended to replace advice given to you by your health care provider. Make sure you discuss any questions you have with your health care provider.     Document Released: 01/18/2000 Document Revised: 02/10/2014 Document Reviewed: 07/14/2011  Elsevier Interactive Patient Education ©2016 [employer] Inc[employer]

## 2014-12-15 ENCOUNTER — Other Ambulatory Visit: Payer: 59 | Admitting: Urology

## 2015-05-29 ENCOUNTER — Other Ambulatory Visit: Payer: Self-pay | Admitting: Obstetrics and Gynecology

## 2015-05-29 DIAGNOSIS — Z1231 Encounter for screening mammogram for malignant neoplasm of breast: Secondary | ICD-10-CM

## 2015-06-14 ENCOUNTER — Ambulatory Visit
Admission: RE | Admit: 2015-06-14 | Discharge: 2015-06-14 | Disposition: A | Discharge status: Home / Self Care | Payer: 59 | Source: Ambulatory Visit | Attending: Obstetrics and Gynecology | Admitting: Obstetrics and Gynecology

## 2015-06-14 DIAGNOSIS — Z1231 Encounter for screening mammogram for malignant neoplasm of breast: Secondary | ICD-10-CM

## 2016-03-27 ENCOUNTER — Other Ambulatory Visit: Payer: Self-pay | Admitting: *Deleted

## 2016-03-27 DIAGNOSIS — N390 Urinary tract infection, site not specified: Secondary | ICD-10-CM

## 2016-03-28 ENCOUNTER — Encounter: Payer: Self-pay | Admitting: Urology

## 2016-03-28 ENCOUNTER — Ambulatory Visit (INDEPENDENT_AMBULATORY_CARE_PROVIDER_SITE_OTHER): Payer: 59 | Admitting: Urology

## 2016-03-28 ENCOUNTER — Other Ambulatory Visit
Admission: RE | Admit: 2016-03-28 | Discharge: 2016-03-28 | Disposition: A | Discharge status: Home / Self Care | Payer: 59 | Source: Ambulatory Visit | Attending: Urology | Admitting: Urology

## 2016-03-28 VITALS — BP 131/79 | HR 125 | Ht 64.0 in | Wt 218.0 lb

## 2016-03-28 DIAGNOSIS — Z87448 Personal history of other diseases of urinary system: Secondary | ICD-10-CM | POA: Diagnosis not present

## 2016-03-28 DIAGNOSIS — N135 Crossing vessel and stricture of ureter without hydronephrosis: Secondary | ICD-10-CM

## 2016-03-28 DIAGNOSIS — R300 Dysuria: Secondary | ICD-10-CM | POA: Diagnosis not present

## 2016-03-28 DIAGNOSIS — B3731 Acute candidiasis of vulva and vagina: Secondary | ICD-10-CM

## 2016-03-28 DIAGNOSIS — N390 Urinary tract infection, site not specified: Secondary | ICD-10-CM | POA: Insufficient documentation

## 2016-03-28 DIAGNOSIS — B373 Candidiasis of vulva and vagina: Secondary | ICD-10-CM | POA: Diagnosis not present

## 2016-03-28 LAB — URINALYSIS, COMPLETE (UACMP) WITH MICROSCOPIC
Bilirubin Urine: NEGATIVE
Glucose, UA: NEGATIVE mg/dL
KETONES UR: 15 mg/dL — AB
Nitrite: POSITIVE — AB
PH: 7 (ref 5.0–8.0)
PROTEIN: NEGATIVE mg/dL
Specific Gravity, Urine: 1.02 (ref 1.005–1.030)

## 2016-03-28 MED ORDER — CEPHALEXIN 500 MG PO CAPS: 500.0000 mg | ORAL_CAPSULE | Freq: Two times a day (BID) | ORAL | 0 refills | Status: DC

## 2016-03-28 MED ORDER — FLUCONAZOLE 150 MG PO TABS: 150.0000 mg | ORAL_TABLET | Freq: Once | ORAL | 0 refills | Status: AC

## 2016-03-28 NOTE — Patient Instructions (Signed)

## 2016-03-28 NOTE — Progress Notes (Signed)
9:00 AM   Kristen Mcmahon 09-12-1970 [id number]  Referring provider: Tracie Harrier, MD [address] [facility] [place], [place] [address]  Chief Complaint  Patient presents with  . Recurrent UTI    last seen 12/2014    HPI: 46 yo WF who presents today for an urgent appointment for symptoms of an UTI.  Mild chronic right UPJ obstruction on the CT urogram completed 2014.   RUS performed in 2016 was normal.  Cystoscopy in 2016 was normal.    Today, she is complaining of dysuria for two and one half weeks.  She is having some urge incontinence associated with the dysuria.  She is not experiencing suprapubic pain., low back pain or flank pain.  She has not been having fevers, chills, nausea or vomiting.    She has baseline stress incontinence.   She is wearing incontinence pains and goes through three pads daily.  Her leakage volume is small.    Her UA today demonstrates TNTC WBC's, 6-30 RBC's, many bacteria and nitrite positive.     PMH: Past Medical History:  Diagnosis Date  . Aortic valve disorder   . Asthma   . Dysuria   . Gross hematuria   . Kidney stones   . Migraines   . Reflux   . Rhinitis   . UTI (lower urinary tract infection)     Surgical History: Past Surgical History:  Procedure Laterality Date  . ABDOMINAL HYSTERECTOMY    . Hallux rigidus of left foot  2014    Home Medications:  Allergies as of 03/28/2016      Reactions   Sulfa Antibiotics Rash   Tetracycline Other (See Comments)   Caused migraines      Medication List       Accurate as of 03/28/16  9:00 AM. Always use your most recent med list.          ADVAIR DISKUS 250-50 MCG/DOSE Aepb Generic drug:  Fluticasone-Salmeterol 1 puff bid   aspirin EC 81 MG tablet Take 81 mg by mouth daily.   baclofen 10 MG tablet Commonly known as:  LIORESAL Take by mouth.   cephALEXin 500 MG capsule Commonly known as:  KEFLEX Take 1 capsule (500 mg total) by mouth 2 (two) times  daily.   COMBIVENT 18-103 MCG/ACT inhaler Generic drug:  albuterol-ipratropium Inhale into the lungs.   DOCOSAHEXAENOIC ACID PO Take by mouth.   fluconazole 150 MG tablet Commonly known as:  DIFLUCAN Take 1 tablet (150 mg total) by mouth once.   fluconazole 150 MG tablet Commonly known as:  DIFLUCAN Take 1 tablet (150 mg total) by mouth once.   iron polysaccharides 150 MG capsule Commonly known as:  NIFEREX Take by mouth.   lamoTRIgine 200 MG tablet Commonly known as:  LAMICTAL Take by mouth.   losartan-hydrochlorothiazide 50-12.5 MG tablet Commonly known as:  HYZAAR   montelukast 10 MG tablet Commonly known as:  SINGULAIR Take by mouth.   Omega-3 1000 MG Caps Take by mouth.   promethazine 50 MG tablet Commonly known as:  PHENERGAN   RESTASIS 0.05 % ophthalmic emulsion Generic drug:  cycloSPORINE Apply to eye.   simvastatin 20 MG tablet Commonly known as:  ZOCOR Take 20 mg by mouth daily.   simvastatin 20 MG tablet Commonly known as:  ZOCOR Take by mouth.   SUMAtriptan 100 MG tablet Commonly known as:  IMITREX Take by mouth.   traMADol 50 MG tablet Commonly known as:  Veatrice Bourbon  Take by mouth.   URIBEL 118 MG Caps Take 1 capsule (118 mg total) by mouth 4 (four) times daily.   UROGESIC-BLUE 81.6 MG Tabs Take 1 tablet (81.6 mg total) by mouth QID.   Vitamin D (Ergocalciferol) 50000 units Caps capsule Commonly known as:  DRISDOL Take by mouth.       Allergies:  Allergies  Allergen Reactions  . Sulfa Antibiotics Rash  . Tetracycline Other (See Comments)    Caused migraines    Family History: Family History  Problem Relation Age of Onset  . Bladder Cancer Father   . Diabetes Mellitus II Father   . Prostate cancer Father   . Kidney disease Neg Hx   . Kidney cancer Neg Hx     Social History:  reports that she has quit smoking. She has never used smokeless tobacco. She reports that she drinks alcohol. She reports that she does not use  drugs.  ROS: UROLOGY Frequent Urination?: No Hard to postpone urination?: No Burning/pain with urination?: Yes Get up at night to urinate?: No Leakage of urine?: Yes Urine stream starts and stops?: No Trouble starting stream?: No Do you have to strain to urinate?: No Blood in urine?: No Urinary tract infection?: Yes Sexually transmitted disease?: No Injury to kidneys or bladder?: No Painful intercourse?: No Weak stream?: No Currently pregnant?: No Vaginal bleeding?: No Last menstrual period?: n  Gastrointestinal Nausea?: No Vomiting?: No Indigestion/heartburn?: No Diarrhea?: No Constipation?: No  Constitutional Fever: No Night sweats?: No Weight loss?: No Fatigue?: No  Skin Skin rash/lesions?: No Itching?: No  Eyes Blurred vision?: No Double vision?: No  Ears/Nose/Throat Sore throat?: No Sinus problems?: No  Hematologic/Lymphatic Swollen glands?: No Easy bruising?: No  Cardiovascular Leg swelling?: No Chest pain?: No  Respiratory Cough?: No Shortness of breath?: Yes  Endocrine Excessive thirst?: No  Musculoskeletal Back pain?: No Joint pain?: No  Neurological Headaches?: No Dizziness?: No  Psychologic Depression?: No Anxiety?: No  Physical Exam: Blood pressure 131/79, pulse (!) 125, height 5\' 4"  (1.626 m), weight 218 lb (98.9 kg). Constitutional: Well nourished. Alert and oriented, No acute distress. HEENT: [place] AT, moist mucus membranes. Trachea midline, no masses. Cardiovascular: No clubbing, cyanosis, or edema. Respiratory: Normal respiratory effort, no increased work of breathing. GI: Abdomen is soft, non tender, non distended, no abdominal masses. Liver and spleen not palpable.  No hernias appreciated.  Stool sample for occult testing is not indicated.   GU: No CVA tenderness.  No bladder fullness or masses.   Skin: No rashes, bruises or suspicious lesions. Lymph: No cervical or inguinal adenopathy. Neurologic: Grossly intact,  no focal deficits, moving all 4 extremities. Psychiatric: Normal mood and affect.  Laboratory Data: Urinalysis: Results for orders placed or performed during the hospital encounter of 03/28/16  Urinalysis, Complete w Microscopic  Result Value Ref Range   Color, Urine YELLOW YELLOW   APPearance CLOUDY (A) CLEAR   Specific Gravity, Urine 1.020 1.005 - 1.030   pH 7.0 5.0 - 8.0   Glucose, UA NEGATIVE NEGATIVE mg/dL   Hgb urine dipstick MODERATE (A) NEGATIVE   Bilirubin Urine NEGATIVE NEGATIVE   Ketones, ur 15 (A) NEGATIVE mg/dL   Protein, ur NEGATIVE NEGATIVE mg/dL   Nitrite POSITIVE (A) NEGATIVE   Leukocytes, UA MODERATE (A) NEGATIVE   Squamous Epithelial / LPF 6-30 (A) NONE SEEN   WBC, UA TOO NUMEROUS TO COUNT 0 - 5 WBC/hpf   RBC / HPF 6-30 0 - 5 RBC/hpf   Bacteria, UA MANY (A)  NONE SEEN   Budding Yeast PRESENT     Assessment & Plan:   1. Dysuria  - UA suspicious for infection  - urine sent for culture  - started Keflex, will adjust if necessary once culture is available  - Urinalysis, Complete  2. Microscopic hematuria  - CT urogram in 2014 - mild right UPJ obstruction - RUS normal and cystoscopy normal in 2016   - UA with 6-30 RBC's - most likely UTI - recheck in 2 weeks   3. Mild chronic right UPJ obstruction  - RUS normal in 2016    4. Vaginal yeast infection:   Patient was prescribed Diflucan tablets 150 mg for 3 days.    I advised her not to take her simvastatin on the day she takes her Diflucan tablets.     Return in about 2 weeks (around 04/11/2016) for UA only.  Zara Council, [facility] [address] [place], [place] [address] [phone]

## 2016-03-30 LAB — URINE CULTURE: Culture: >=100000 — AB

## 2016-04-01 ENCOUNTER — Telehealth: Payer: Self-pay | Admitting: *Deleted

## 2016-04-01 NOTE — Telephone Encounter (Signed)
Spoke with patient and gave results. Patient to continue antibiotics and come in the office in two weeks for a urinalysis on the nurse schedule. Patient ok with plan.

## 2016-04-01 NOTE — Telephone Encounter (Signed)
-----   Message from Nori Riis, PA-C sent at 03/31/2016  8:01 AM EST ----- Patient has a +UCx.  She can continue the Keflex twice daily for seven days.  They also need to take a probiotic with the antibiotic course.  I will also like to have her UA rechecked in 2 weeks.

## 2016-04-18 ENCOUNTER — Telehealth: Payer: Self-pay

## 2016-04-18 ENCOUNTER — Other Ambulatory Visit
Admission: RE | Admit: 2016-04-18 | Discharge: 2016-04-18 | Disposition: A | Discharge status: Home / Self Care | Payer: 59 | Source: Ambulatory Visit | Attending: Urology | Admitting: Urology

## 2016-04-18 ENCOUNTER — Other Ambulatory Visit: Payer: Self-pay | Admitting: *Deleted

## 2016-04-18 DIAGNOSIS — R3129 Other microscopic hematuria: Secondary | ICD-10-CM

## 2016-04-18 LAB — URINALYSIS, COMPLETE (UACMP) WITH MICROSCOPIC
GLUCOSE, UA: NEGATIVE mg/dL
Leukocytes, UA: NEGATIVE
Nitrite: NEGATIVE
PROTEIN: NEGATIVE mg/dL
SPECIFIC GRAVITY, URINE: 1.02 (ref 1.005–1.030)
pH: 5.5 (ref 5.0–8.0)

## 2016-04-18 NOTE — Telephone Encounter (Signed)
Spoke with pt in reference to blood in urine and sending urine for cx. Pt voiced understanding.

## 2016-04-18 NOTE — Telephone Encounter (Signed)
LMOM

## 2016-04-18 NOTE — Telephone Encounter (Signed)
-----   Message from Nori Riis, PA-C sent at 04/18/2016 12:24 PM EDT ----- Her urine is positive for blood.  We are sending it for culture.

## 2016-04-19 LAB — URINE CULTURE

## 2016-04-21 ENCOUNTER — Telehealth: Payer: Self-pay | Admitting: *Deleted

## 2016-04-21 NOTE — Telephone Encounter (Signed)
LM with female to have patient call the office back.

## 2016-04-21 NOTE — Telephone Encounter (Signed)
Spoke with patient and gave message. Patient ok with plan and transferred up front to angie to make appointment on the nurse scheduled for catheterized specimen for culture.

## 2016-04-21 NOTE — Telephone Encounter (Signed)
-----   Message from Nori Riis, PA-C sent at 04/20/2016  7:56 PM EDT ----- Unfortunately, more than 1 bacteria grew out of her urine. We will need to catheterize her and resubmit for urine culture.

## 2016-04-23 ENCOUNTER — Ambulatory Visit (INDEPENDENT_AMBULATORY_CARE_PROVIDER_SITE_OTHER): Payer: 59

## 2016-04-23 VITALS — BP 132/79 | HR 108 | Ht 64.0 in | Wt 207.1 lb

## 2016-04-23 DIAGNOSIS — Z87448 Personal history of other diseases of urinary system: Secondary | ICD-10-CM

## 2016-04-23 NOTE — Progress Notes (Signed)
In and Out Catheterization  Patient is present today for a I & O catheterization due to previous culture growing more than one organism. Patient was cleaned and prepped in a sterile fashion with betadine and Lidocaine 2% jelly was instilled into the urethra.  A 14FR cath was inserted no complications were noted , 167ml of urine return was noted, urine was yellow in color. A clean urine sample was collected for cx. Bladder was drained  And catheter was removed with out difficulty.    Preformed by: Toniann Fail, LPN   Blood pressure 132/79, pulse (!) 108, height 5\' 4"  (1.626 m), weight 207 lb 1.6 oz (93.9 kg).

## 2016-04-26 LAB — CULTURE, URINE COMPREHENSIVE

## 2016-04-28 ENCOUNTER — Telehealth: Payer: Self-pay | Admitting: *Deleted

## 2016-04-28 DIAGNOSIS — G43909 Migraine, unspecified, not intractable, without status migrainosus: Secondary | ICD-10-CM | POA: Diagnosis not present

## 2016-04-28 DIAGNOSIS — M533 Sacrococcygeal disorders, not elsewhere classified: Secondary | ICD-10-CM | POA: Diagnosis not present

## 2016-04-28 DIAGNOSIS — R3129 Other microscopic hematuria: Secondary | ICD-10-CM

## 2016-04-28 DIAGNOSIS — J4520 Mild intermittent asthma, uncomplicated: Secondary | ICD-10-CM | POA: Diagnosis not present

## 2016-04-28 NOTE — Telephone Encounter (Signed)
Spoke with patient and gave message. Patient ok with plan CT ordered and patient call forwarded to front Leodis Sias  to book appointment for cystoscopy.

## 2016-04-28 NOTE — Telephone Encounter (Signed)
-----   Message from Nori Riis, PA-C sent at 04/27/2016  8:08 PM EDT ----- Please let the patient know that her urine culture was negative. Because of this, we will need to pursue a hematuria workup due to the microscopic blood seen on her urinalysis. We will need to schedule her for a CT urogram and cystoscopy

## 2016-05-05 DIAGNOSIS — G43909 Migraine, unspecified, not intractable, without status migrainosus: Secondary | ICD-10-CM | POA: Diagnosis not present

## 2016-05-05 DIAGNOSIS — E559 Vitamin D deficiency, unspecified: Secondary | ICD-10-CM | POA: Diagnosis not present

## 2016-05-05 DIAGNOSIS — J4520 Mild intermittent asthma, uncomplicated: Secondary | ICD-10-CM | POA: Diagnosis not present

## 2016-05-06 ENCOUNTER — Ambulatory Visit
Admission: RE | Admit: 2016-05-06 | Discharge: 2016-05-06 | Disposition: A | Discharge status: Home / Self Care | Payer: 59 | Source: Ambulatory Visit | Attending: Urology | Admitting: Urology

## 2016-05-06 DIAGNOSIS — Z90710 Acquired absence of both cervix and uterus: Secondary | ICD-10-CM | POA: Diagnosis not present

## 2016-05-06 DIAGNOSIS — R3129 Other microscopic hematuria: Secondary | ICD-10-CM | POA: Insufficient documentation

## 2016-05-06 DIAGNOSIS — K760 Fatty (change of) liver, not elsewhere classified: Secondary | ICD-10-CM | POA: Diagnosis not present

## 2016-05-06 DIAGNOSIS — Z87442 Personal history of urinary calculi: Secondary | ICD-10-CM | POA: Insufficient documentation

## 2016-05-06 DIAGNOSIS — M899 Disorder of bone, unspecified: Secondary | ICD-10-CM | POA: Insufficient documentation

## 2016-05-06 HISTORY — DX: Essential (primary) hypertension: I10

## 2016-05-06 MED ORDER — IOPAMIDOL (ISOVUE-300) INJECTION 61%
150.0000 mL | Freq: Once | INTRAVENOUS | Status: AC | PRN
  Administered 2016-05-06: 125 mL via INTRAVENOUS

## 2016-05-08 ENCOUNTER — Telehealth: Payer: Self-pay | Admitting: Urology

## 2016-05-08 NOTE — Telephone Encounter (Signed)
Please let the patient know that Ms. Ronnald Ramp, CNM has seen her CT scan and the fullness in her vaginal cuff was due to her supracervical hysterectomy for menorrhagia and is not worrisome.

## 2016-05-08 NOTE — Telephone Encounter (Signed)
LMOM

## 2016-05-13 NOTE — Telephone Encounter (Signed)
Spoke with pt in reference to CT results and Ms. Ronnald Ramp. Pt voiced understanding.

## 2016-05-28 ENCOUNTER — Other Ambulatory Visit: Payer: 59

## 2016-05-30 ENCOUNTER — Ambulatory Visit: Payer: 59 | Admitting: Urology

## 2016-06-03 ENCOUNTER — Other Ambulatory Visit: Payer: Self-pay | Admitting: Obstetrics and Gynecology

## 2016-06-03 DIAGNOSIS — Z124 Encounter for screening for malignant neoplasm of cervix: Secondary | ICD-10-CM | POA: Diagnosis not present

## 2016-06-03 DIAGNOSIS — Z1231 Encounter for screening mammogram for malignant neoplasm of breast: Secondary | ICD-10-CM

## 2016-06-03 DIAGNOSIS — Z01419 Encounter for gynecological examination (general) (routine) without abnormal findings: Secondary | ICD-10-CM | POA: Diagnosis not present

## 2016-06-05 ENCOUNTER — Other Ambulatory Visit: Payer: Self-pay

## 2016-06-05 DIAGNOSIS — R3129 Other microscopic hematuria: Secondary | ICD-10-CM

## 2016-06-06 ENCOUNTER — Encounter: Payer: Self-pay | Admitting: Urology

## 2016-06-06 ENCOUNTER — Other Ambulatory Visit
Admission: RE | Admit: 2016-06-06 | Discharge: 2016-06-06 | Disposition: A | Discharge status: Home / Self Care | Payer: 59 | Source: Ambulatory Visit | Attending: Urology | Admitting: Urology

## 2016-06-06 ENCOUNTER — Ambulatory Visit (INDEPENDENT_AMBULATORY_CARE_PROVIDER_SITE_OTHER): Payer: 59 | Admitting: Urology

## 2016-06-06 VITALS — BP 119/71 | HR 95 | Ht 64.0 in | Wt 200.0 lb

## 2016-06-06 DIAGNOSIS — K5900 Constipation, unspecified: Secondary | ICD-10-CM

## 2016-06-06 DIAGNOSIS — R3129 Other microscopic hematuria: Secondary | ICD-10-CM | POA: Diagnosis not present

## 2016-06-06 DIAGNOSIS — N135 Crossing vessel and stricture of ureter without hydronephrosis: Secondary | ICD-10-CM

## 2016-06-06 LAB — URINALYSIS, COMPLETE (UACMP) WITH MICROSCOPIC
Bilirubin Urine: NEGATIVE
Glucose, UA: NEGATIVE mg/dL
Ketones, ur: NEGATIVE mg/dL
Leukocytes, UA: NEGATIVE
Nitrite: NEGATIVE
PROTEIN: NEGATIVE mg/dL
Specific Gravity, Urine: 1.015 (ref 1.005–1.030)
WBC UA: NONE SEEN WBC/hpf (ref 0–5)
pH: 7 (ref 5.0–8.0)

## 2016-06-06 MED ORDER — LIDOCAINE HCL 2 % EX GEL
1.0000 "application " | Freq: Once | CUTANEOUS | Status: AC
  Administered 2016-06-06: 1 via URETHRAL

## 2016-06-06 MED ORDER — CIPROFLOXACIN HCL 500 MG PO TABS
500.0000 mg | ORAL_TABLET | Freq: Once | ORAL | Status: AC
  Administered 2016-06-06: 500 mg via ORAL

## 2016-06-06 NOTE — Progress Notes (Signed)
   06/06/16  CC:  Chief Complaint  Patient presents with  . Cysto    HPI: 46 year old female who presents today for office cystoscopy for further evaluation of microscopic hematuria. She's had several workups in the past including a CT urogram in 2014 and cystoscopy in 12/2014 which were negative.   She does have what appears to be a chronic mild right UPJ obstruction but is asymptomatic from this. No parenchymal loss. This has been followed followed conservatively.  She was treated for an Escherichia coli urinary tract infection in 03/2016 but subsequent urine cultures of the negative with persistent microscopic blood.  Remote history of kidney stones.  Former smoker.  Blood pressure 119/71, pulse 95, height 5\' 4"  (1.626 m), weight 200 lb (90.7 kg). NED. A&Ox3.   No respiratory distress   Abd soft, NT, ND Normal external genitalia with patent urethral meatus  Cystoscopy Procedure Note  Patient identification was confirmed, informed consent was obtained, and patient was prepped using Betadine solution.  Lidocaine jelly was administered per urethral meatus.    Preoperative abx where received prior to procedure.    Procedure: - Flexible cystoscope introduced, without any difficulty.   - Thorough search of the bladder revealed:    normal urethral meatus    normal urothelium    no stones    no ulcers     no tumors    no urethral polyps    no trabeculation    Extrinsic compression from what appears to be bowel present, likely related to constipation  - Ureteral orifices were normal in position and appearance.  Post-Procedure: - Patient tolerated the procedure well  Assessment/ Plan:  1. Microscopic hematuria s/p negative cysto and repeat CT Urogram Microscopic hematuria appears to be chronic, with plan for cystoscopy every 2-3 years if persists with lower level imaging such as renal ultrasound - ciprofloxacin (CIPRO) tablet 500 mg; Take 1 tablet (500 mg total) by mouth  once. - lidocaine (XYLOCAINE) 2 % jelly 1 application; Place 1 application into the urethra once.  2. UPJ (ureteropelvic junction) obstruction Chronic, stable, asymptomatic --> CT imaging reviewed personally today and no evidence of cortical loss or atrophy Follow conservatively  3. Constipation Extrinsic compression of stool balls visible from within bladder, consistent with constipation She does endorse constipation Advised a bowel cleanout followed by maintenance regimen with daily stool softener   Follow up as needed  Hollice Espy, MD

## 2016-06-25 ENCOUNTER — Ambulatory Visit
Admission: RE | Admit: 2016-06-25 | Discharge: 2016-06-25 | Disposition: A | Discharge status: Home / Self Care | Payer: 59 | Source: Ambulatory Visit | Attending: Obstetrics and Gynecology | Admitting: Obstetrics and Gynecology

## 2016-06-25 DIAGNOSIS — Z1231 Encounter for screening mammogram for malignant neoplasm of breast: Secondary | ICD-10-CM | POA: Insufficient documentation

## 2016-11-07 DIAGNOSIS — Z23 Encounter for immunization: Secondary | ICD-10-CM | POA: Diagnosis not present

## 2016-12-03 DIAGNOSIS — J4520 Mild intermittent asthma, uncomplicated: Secondary | ICD-10-CM | POA: Diagnosis not present

## 2016-12-03 DIAGNOSIS — E559 Vitamin D deficiency, unspecified: Secondary | ICD-10-CM | POA: Diagnosis not present

## 2016-12-03 DIAGNOSIS — G43909 Migraine, unspecified, not intractable, without status migrainosus: Secondary | ICD-10-CM | POA: Diagnosis not present

## 2016-12-03 DIAGNOSIS — R8290 Unspecified abnormal findings in urine: Secondary | ICD-10-CM | POA: Diagnosis not present

## 2016-12-10 DIAGNOSIS — J4520 Mild intermittent asthma, uncomplicated: Secondary | ICD-10-CM | POA: Diagnosis not present

## 2016-12-10 DIAGNOSIS — N390 Urinary tract infection, site not specified: Secondary | ICD-10-CM | POA: Diagnosis not present

## 2016-12-10 DIAGNOSIS — G43909 Migraine, unspecified, not intractable, without status migrainosus: Secondary | ICD-10-CM | POA: Diagnosis not present

## 2017-04-29 DIAGNOSIS — I272 Pulmonary hypertension, unspecified: Secondary | ICD-10-CM | POA: Diagnosis not present

## 2017-04-29 DIAGNOSIS — R000 Tachycardia, unspecified: Secondary | ICD-10-CM | POA: Diagnosis not present

## 2017-04-29 DIAGNOSIS — R0600 Dyspnea, unspecified: Secondary | ICD-10-CM | POA: Diagnosis not present

## 2017-04-29 DIAGNOSIS — R0609 Other forms of dyspnea: Secondary | ICD-10-CM | POA: Diagnosis not present

## 2017-05-06 IMAGING — CT CT ABD-PEL WO/W CM
3 of 12 series · 12 of 46 positions shown, 18 images · IV contrast (iopamidol)
Comparison: Multiple exams, including renal ultrasound 12/04/2014
and CT abdomen from 12/22/2012

CLINICAL DATA: Microhematuria last week. Urinary tract infection 3
weeks ago with pelvic pain. History of renal calculi.

EXAM:
CT ABDOMEN AND PELVIS WITHOUT AND WITH CONTRAST
TECHNIQUE: Multidetector CT imaging of the abdomen and pelvis was performed
following the standard protocol before and following the bolus
administration of intravenous contrast.
CONTRAST:  125mL [id number] IOPAMIDOL (W6Z61E-TMM) INJECTION 61%

[Series 2: hematuria > 45 wo · axial · 0.70mm/px · z∈[-842,-707]mm · 3 of 96 slices shown]
[im 14/96  soft-tissue]
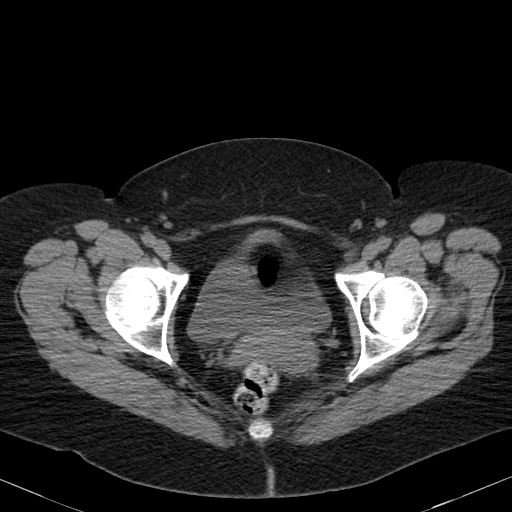
[im 28/96  soft-tissue]
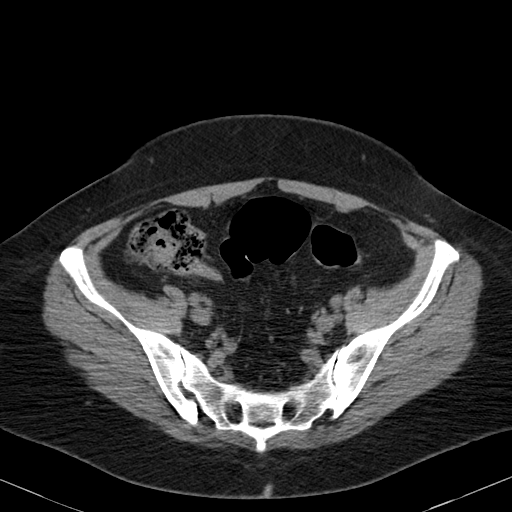
[im 41/96  soft-tissue]
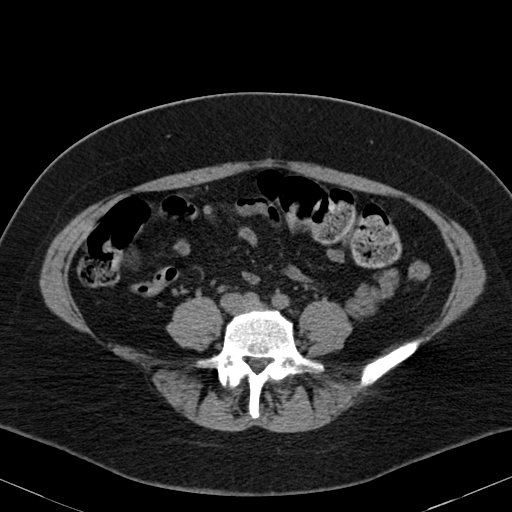

[Series 9: hematuria > 45 10 min delay · axial · delayed · 0.70mm/px · z∈[-823,-438]mm · 7 of 103 slices shown, 12 images]
[im 13/103  soft-tissue]
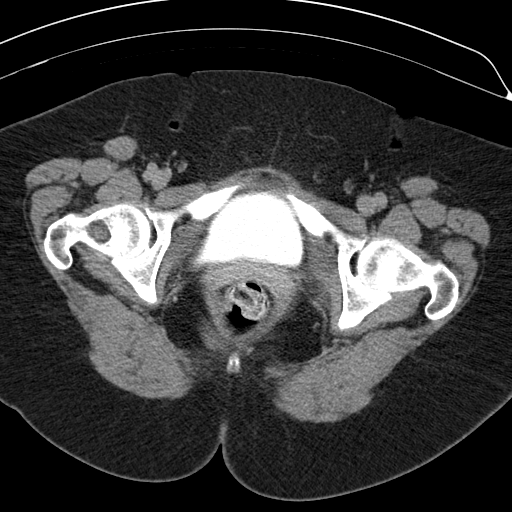
[im 13/103  bone]
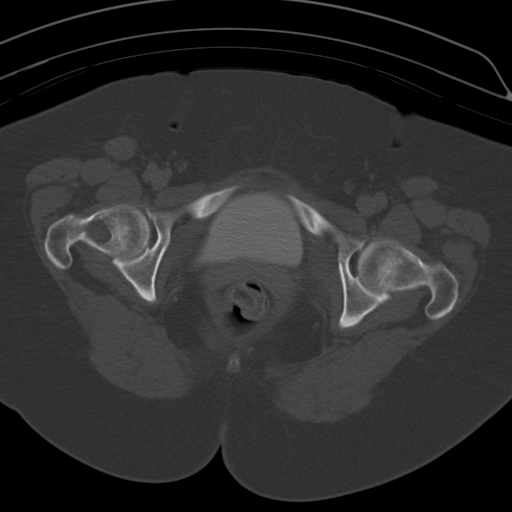
[im 26/103  soft-tissue]
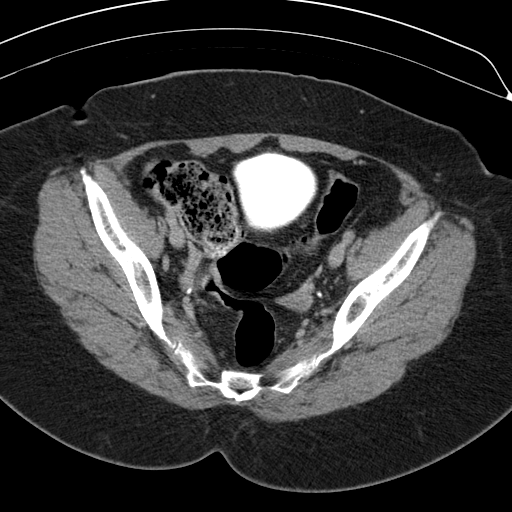
[im 39/103  soft-tissue]
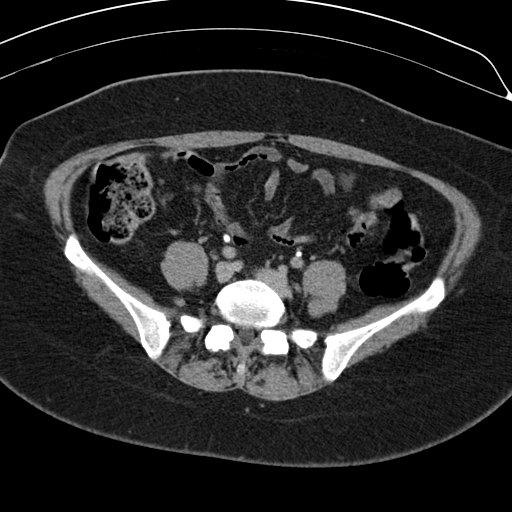
[im 52/103  soft-tissue]
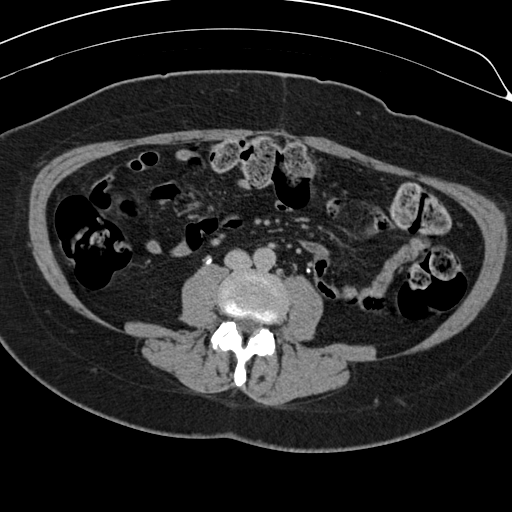
[im 52/103  lung]
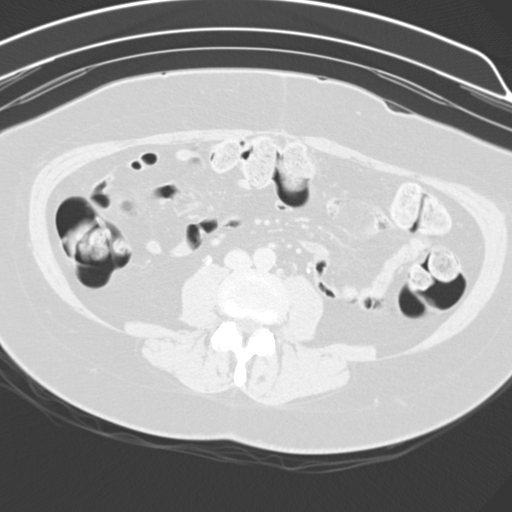
[im 64/103  soft-tissue]
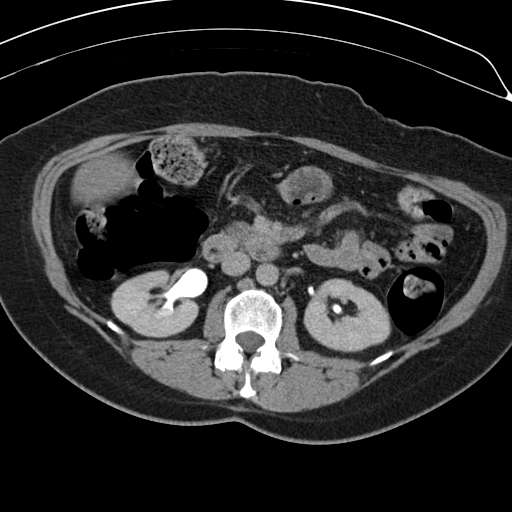
[im 64/103  lung]
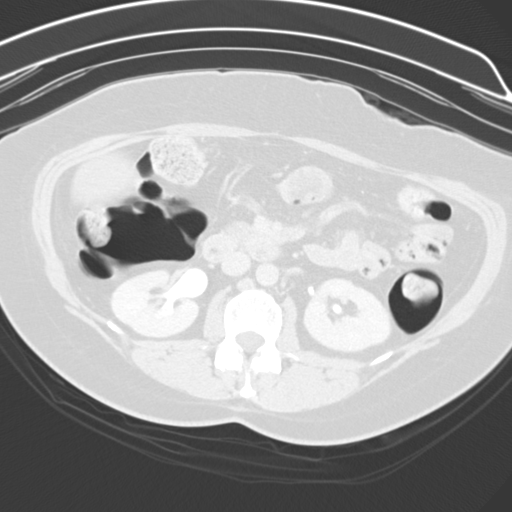
[im 77/103  soft-tissue]
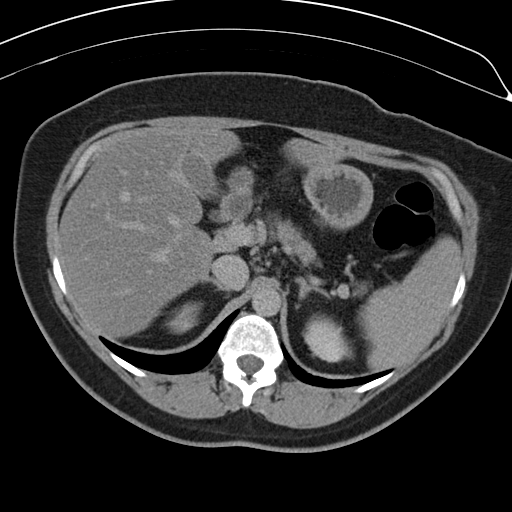
[im 77/103  lung]
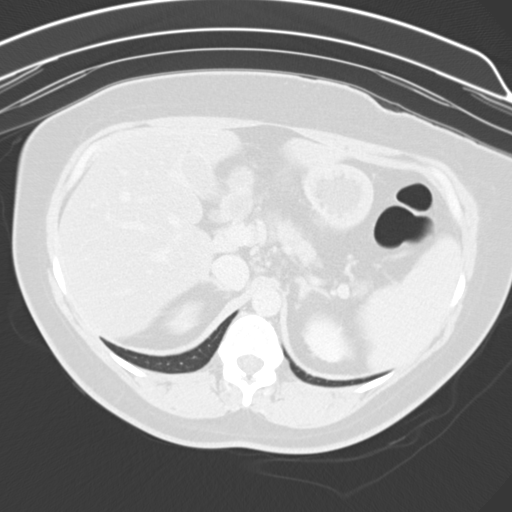
[im 90/103  soft-tissue]
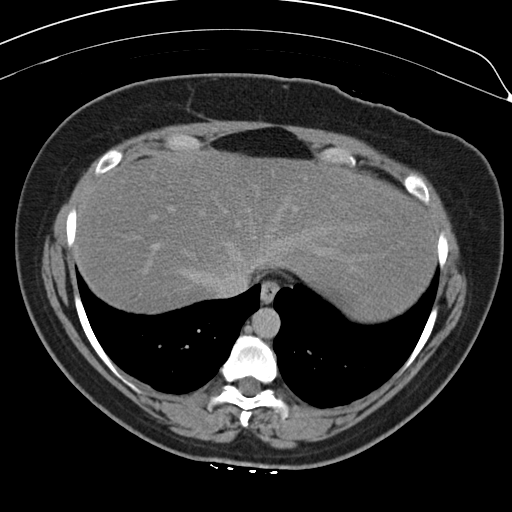
[im 90/103  lung]
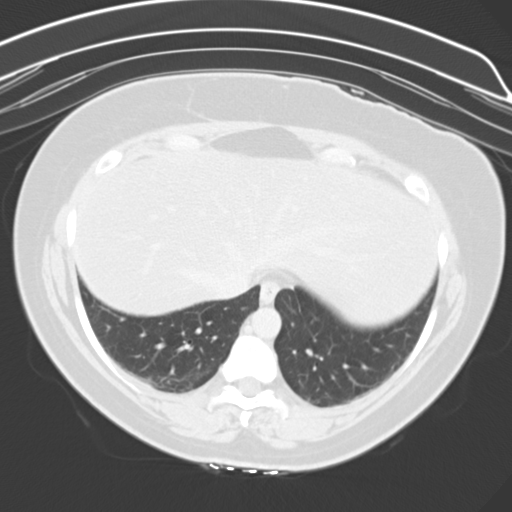

[Series 602: coronal wo · coronal · 0.93mm/px · 2 of 120 slices shown, 3 images]
[im 40/120  soft-tissue]
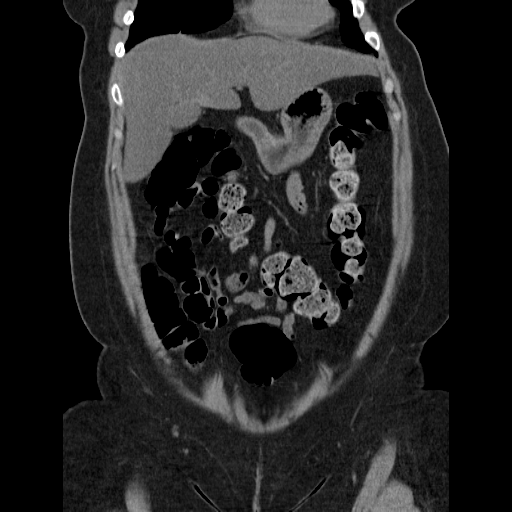
[im 40/120  bone]
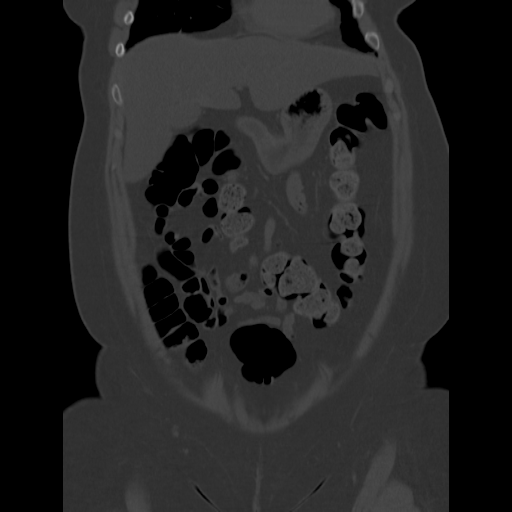
[im 80/120  soft-tissue]
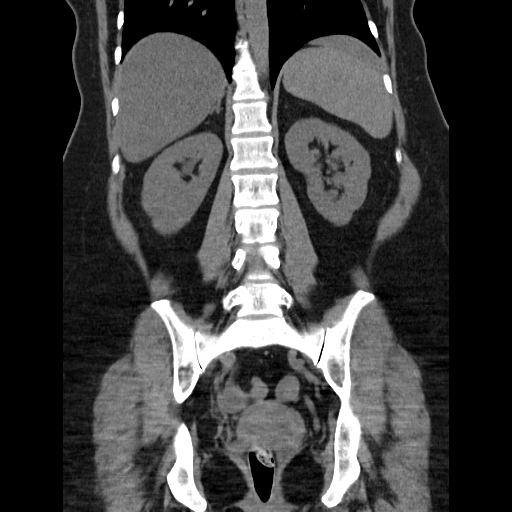

[12 of 46 positions shown; findings below may reference images not displayed]

FINDINGS: Lower chest: Unremarkable

Hepatobiliary: Diffuse hepatic steatosis.

Pancreas: Unremarkable

Spleen: Unremarkable

Adrenals/Urinary Tract: A 3 mm calcification in the left hemipelvis
on image 79/2 is a vascular phlebolith and not within the ureter. No
urinary tract calculi are observed. No significant abnormal
parenchymal enhancement in the kidneys. No significant abnormal
urothelial filling defect is identified. A cause for hematuria is
not seen.

Stomach/Bowel: Unremarkable

Vascular/Lymphatic: Unremarkable

Reproductive: Soft tissue fullness along the vaginal cuff,
significance uncertain. Small cysts or follicles in the right ovary.

Other: No supplemental non-categorized findings.

Musculoskeletal: Lucent lesion extending from the right femoral neck
into the intertrochanteric region and femoral shaft, sclerotic
margins in the intertrochanteric region, similar size in
distribution to the 4936 exam, appearance suspicious for fibrous
dysplasia.
IMPRESSION: 1. A cause for hematuria is not identified.
2. Diffuse hepatic steatosis.
3. Chronic lucent lesion in the right proximal femur, appearance
suspicious for fibrous dysplasia. No change from 4936.
4. Soft tissue fullness along the vaginal cuff. If the patient's
hysterectomy was for cervical cancer or other malignancy then this
might warrant further workup or surveillance, otherwise it is
probably incidental.

## 2017-05-15 DIAGNOSIS — R0602 Shortness of breath: Secondary | ICD-10-CM | POA: Diagnosis not present

## 2017-06-09 ENCOUNTER — Other Ambulatory Visit: Payer: Self-pay | Admitting: Obstetrics and Gynecology

## 2017-06-09 DIAGNOSIS — Z1211 Encounter for screening for malignant neoplasm of colon: Secondary | ICD-10-CM | POA: Diagnosis not present

## 2017-06-09 DIAGNOSIS — Z01419 Encounter for gynecological examination (general) (routine) without abnormal findings: Secondary | ICD-10-CM | POA: Diagnosis not present

## 2017-06-09 DIAGNOSIS — J4520 Mild intermittent asthma, uncomplicated: Secondary | ICD-10-CM | POA: Diagnosis not present

## 2017-06-09 DIAGNOSIS — G43909 Migraine, unspecified, not intractable, without status migrainosus: Secondary | ICD-10-CM | POA: Diagnosis not present

## 2017-06-09 DIAGNOSIS — Z1231 Encounter for screening mammogram for malignant neoplasm of breast: Secondary | ICD-10-CM

## 2017-06-09 DIAGNOSIS — E7800 Pure hypercholesterolemia, unspecified: Secondary | ICD-10-CM | POA: Diagnosis not present

## 2017-06-09 DIAGNOSIS — N390 Urinary tract infection, site not specified: Secondary | ICD-10-CM | POA: Diagnosis not present

## 2017-06-15 DIAGNOSIS — Z1211 Encounter for screening for malignant neoplasm of colon: Secondary | ICD-10-CM | POA: Diagnosis not present

## 2017-06-17 DIAGNOSIS — Z23 Encounter for immunization: Secondary | ICD-10-CM | POA: Diagnosis not present

## 2017-06-19 DIAGNOSIS — Z23 Encounter for immunization: Secondary | ICD-10-CM | POA: Diagnosis not present

## 2017-06-23 DIAGNOSIS — R0602 Shortness of breath: Secondary | ICD-10-CM | POA: Diagnosis not present

## 2017-06-23 DIAGNOSIS — Z7689 Persons encountering health services in other specified circumstances: Secondary | ICD-10-CM | POA: Diagnosis not present

## 2017-06-23 DIAGNOSIS — I272 Pulmonary hypertension, unspecified: Secondary | ICD-10-CM | POA: Diagnosis not present

## 2017-07-01 ENCOUNTER — Ambulatory Visit: Payer: 59

## 2017-07-01 DIAGNOSIS — N912 Amenorrhea, unspecified: Secondary | ICD-10-CM | POA: Diagnosis not present

## 2017-07-08 ENCOUNTER — Encounter (INDEPENDENT_AMBULATORY_CARE_PROVIDER_SITE_OTHER): Payer: Self-pay

## 2017-07-08 ENCOUNTER — Ambulatory Visit
Admission: RE | Admit: 2017-07-08 | Discharge: 2017-07-08 | Disposition: A | Discharge status: Home / Self Care | Payer: 59 | Source: Ambulatory Visit | Attending: Obstetrics and Gynecology | Admitting: Obstetrics and Gynecology

## 2017-07-08 DIAGNOSIS — Z1231 Encounter for screening mammogram for malignant neoplasm of breast: Secondary | ICD-10-CM

## 2017-07-13 ENCOUNTER — Other Ambulatory Visit: Payer: Self-pay | Admitting: Obstetrics and Gynecology

## 2017-07-13 DIAGNOSIS — N6489 Other specified disorders of breast: Secondary | ICD-10-CM

## 2017-07-13 DIAGNOSIS — R928 Other abnormal and inconclusive findings on diagnostic imaging of breast: Secondary | ICD-10-CM

## 2017-07-17 ENCOUNTER — Ambulatory Visit
Admission: RE | Admit: 2017-07-17 | Discharge: 2017-07-17 | Disposition: A | Discharge status: Home / Self Care | Payer: 59 | Source: Ambulatory Visit | Attending: Obstetrics and Gynecology | Admitting: Obstetrics and Gynecology

## 2017-07-17 DIAGNOSIS — R928 Other abnormal and inconclusive findings on diagnostic imaging of breast: Secondary | ICD-10-CM

## 2017-07-17 DIAGNOSIS — N6489 Other specified disorders of breast: Secondary | ICD-10-CM | POA: Insufficient documentation

## 2017-07-17 DIAGNOSIS — R922 Inconclusive mammogram: Secondary | ICD-10-CM | POA: Diagnosis not present

## 2017-07-17 DIAGNOSIS — N6012 Diffuse cystic mastopathy of left breast: Secondary | ICD-10-CM | POA: Diagnosis not present

## 2017-12-07 DIAGNOSIS — R0602 Shortness of breath: Secondary | ICD-10-CM | POA: Diagnosis not present

## 2017-12-07 DIAGNOSIS — G43909 Migraine, unspecified, not intractable, without status migrainosus: Secondary | ICD-10-CM | POA: Diagnosis not present

## 2017-12-07 DIAGNOSIS — Z0000 Encounter for general adult medical examination without abnormal findings: Secondary | ICD-10-CM | POA: Diagnosis not present

## 2017-12-14 DIAGNOSIS — E559 Vitamin D deficiency, unspecified: Secondary | ICD-10-CM | POA: Diagnosis not present

## 2017-12-14 DIAGNOSIS — G43909 Migraine, unspecified, not intractable, without status migrainosus: Secondary | ICD-10-CM | POA: Diagnosis not present

## 2017-12-14 DIAGNOSIS — M533 Sacrococcygeal disorders, not elsewhere classified: Secondary | ICD-10-CM | POA: Diagnosis not present

## 2018-02-16 ENCOUNTER — Other Ambulatory Visit: Payer: Self-pay

## 2018-02-16 ENCOUNTER — Ambulatory Visit
Admission: EM | Admit: 2018-02-16 | Discharge: 2018-02-16 | Disposition: A | Discharge status: Home / Self Care | Payer: 59 | Attending: Family Medicine | Admitting: Family Medicine

## 2018-02-16 DIAGNOSIS — R059 Cough, unspecified: Secondary | ICD-10-CM

## 2018-02-16 DIAGNOSIS — R05 Cough: Secondary | ICD-10-CM | POA: Diagnosis not present

## 2018-02-16 DIAGNOSIS — R300 Dysuria: Secondary | ICD-10-CM

## 2018-02-16 LAB — URINALYSIS, COMPLETE (UACMP) WITH MICROSCOPIC
BILIRUBIN URINE: NEGATIVE
Glucose, UA: NEGATIVE mg/dL
Ketones, ur: NEGATIVE mg/dL
NITRITE: NEGATIVE
PH: 7 (ref 5.0–8.0)
Protein, ur: NEGATIVE mg/dL
SPECIFIC GRAVITY, URINE: 1.02 (ref 1.005–1.030)

## 2018-02-16 MED ORDER — CEFDINIR 300 MG PO CAPS: 300.0000 mg | ORAL_CAPSULE | Freq: Two times a day (BID) | ORAL | 0 refills | Status: DC

## 2018-02-16 MED ORDER — HYDROCODONE-HOMATROPINE 5-1.5 MG/5ML PO SYRP: 5.0000 mL | ORAL_SOLUTION | Freq: Four times a day (QID) | ORAL | 0 refills | Status: DC | PRN

## 2018-02-16 NOTE — ED Provider Notes (Signed)
[facility]    CSN: [id number] Arrival date & time: 02/16/18  1007  History   Chief Complaint Chief Complaint  Patient presents with  . Cough  . Dysuria   HPI  48 year old female presents with the above complaints.  Patient reports that she has been sick since last week.  He states that she developed cold symptoms and is now plagued by productive cough.  Cough is worse at night and in the morning.  Severe.  Productive.  No reports of fever.  Additionally, patient states that she has had dysuria and urinary frequency for the past month.  No flank pain.  No abdominal pain.  Has a history of frequent UTI.  No other associated symptoms.  No other complaints or concerns at this time.  PMH, Surgical Hx, Family Hx, Social History reviewed and updated as below.  Past Medical History:  Diagnosis Date  . Aortic valve disorder   . Asthma   . Dysuria   . Gross hematuria   . Hypertension   . Kidney stones   . Migraines   . Reflux   . Rhinitis   . UTI (lower urinary tract infection)    Patient Active Problem List   Diagnosis Date Noted  . Autoantibody titer elevated 12/14/2014  . Asthma without status asthmaticus 12/14/2014  . Acid reflux 12/14/2014  . Breath shortness 12/14/2014  . Microscopic hematuria 11/22/2014  . UPJ (ureteropelvic junction) obstruction 11/22/2014  . Vaginal yeast infection 11/22/2014  . Dysuria 11/01/2014  . History of hematuria 11/01/2014  . HLD (hyperlipidemia) 05/23/2014  . Avitaminosis D 05/23/2014  . Headache, migraine 04/27/2014  . Pulmonary hypertension ([place]) 03/14/2011  . Fast heart beat 03/14/2011    Past Surgical History:  Procedure Laterality Date  . ABDOMINAL HYSTERECTOMY    . Hallux rigidus of left foot  2014    OB History   No obstetric history on file.      Home Medications    Prior to Admission medications   Medication Sig Start Date End Date Taking? Authorizing Provider  aspirin EC 81 MG tablet Take 81 mg by  mouth daily.    [provider]  baclofen (LIORESAL) 10 MG tablet Take by mouth. 04/27/14   [provider]  cefdinir (OMNICEF) 300 MG capsule Take 1 capsule (300 mg total) by mouth 2 (two) times daily. 02/16/18   Coral Spikes, DO  cycloSPORINE (RESTASIS) 0.05 % ophthalmic emulsion Apply to eye.    [provider]  DOCOSAHEXAENOIC ACID PO Take by mouth.    [provider]  Fluticasone-Salmeterol (ADVAIR DISKUS) 250-50 MCG/DOSE AEPB 1 puff bid 04/27/14   [provider]  HYDROcodone-homatropine (HYCODAN) 5-1.5 MG/5ML syrup Take 5 mLs by mouth every 6 (six) hours as needed. 02/16/18   Coral Spikes, DO  iron polysaccharides (NIFEREX) 150 MG capsule Take by mouth. 11/05/15 11/04/16  [provider]  lamoTRIgine (LAMICTAL) 200 MG tablet Take by mouth. 04/27/14   [provider]  losartan-hydrochlorothiazide Konrad Penta) 50-12.5 MG tablet  10/30/14   [provider]  Meth-Hyo-M Bl-Na Phos-Ph Sal (URIBEL) 118 MG CAPS Take 1 capsule (118 mg total) by mouth 4 (four) times daily. 11/01/14   Zara Council A, PA-C  montelukast (SINGULAIR) 10 MG tablet Take by mouth. 04/27/14   [provider]  Omega-3 1000 MG CAPS Take by mouth.    [provider]  promethazine (PHENERGAN) 50 MG tablet  10/30/14   [provider]  simvastatin (ZOCOR)  20 MG tablet Take 20 mg by mouth daily.    [provider]  SUMAtriptan (IMITREX) 100 MG tablet Take by mouth. 04/27/14   [provider]  traMADol (ULTRAM) 50 MG tablet Take by mouth. 03/15/14   [provider]  Vitamin D, Ergocalciferol, (DRISDOL) 50000 UNITS CAPS capsule Take by mouth. 04/27/14   [provider]    Family History Family History  Problem Relation Age of Onset  . Bladder Cancer Father   . Diabetes Mellitus II Father   . Prostate cancer Father   . Kidney disease Neg Hx   . Kidney cancer Neg Hx   . Breast cancer Neg Hx     Social  History Social History   Tobacco Use  . Smoking status: Former Research scientist (life sciences)  . Smokeless tobacco: Never Used  . Tobacco comment: quit 22 years  Substance Use Topics  . Alcohol use: Yes    Alcohol/week: 0.0 standard drinks    Comment: occasional  . Drug use: No     Allergies   Sulfa antibiotics and Tetracycline   Review of Systems Review of Systems  Constitutional: Negative for fever.  Respiratory: Positive for cough.   Genitourinary: Positive for dysuria and frequency.   Physical Exam Triage Vital Signs ED Triage Vitals  Enc Vitals Group     BP 02/16/18 1035 (!) 153/92     Pulse Rate 02/16/18 1035 87     Resp 02/16/18 1035 18     Temp 02/16/18 1035 98.4 F (36.9 C)     Temp Source 02/16/18 1035 Oral     SpO2 02/16/18 1035 98 %     Weight 02/16/18 1035 204 lb (92.5 kg)     Height 02/16/18 1035 5\' 4"  (1.626 m)     Head Circumference --      Peak Flow --      Pain Score 02/16/18 1036 0     Pain Loc --      Pain Edu? --      Excl. in [place]? --    No data found.  Updated Vital Signs BP (!) 153/92 (BP Location: Right Arm)   Pulse 87   Temp 98.4 F (36.9 C) (Oral)   Resp 18   Ht 5\' 4"  (1.626 m)   Wt 92.5 kg   SpO2 98%   BMI 35.02 kg/m   Visual Acuity Right Eye Distance:   Left Eye Distance:   Bilateral Distance:    Right Eye Near:   Left Eye Near:    Bilateral Near:     Physical Exam Vitals signs and nursing note reviewed.  Constitutional:      General: She is not in acute distress. HENT:     Head: Normocephalic and atraumatic.  Eyes:     General:        Right eye: No discharge.        Left eye: No discharge.     Conjunctiva/sclera: Conjunctivae normal.  Cardiovascular:     Rate and Rhythm: Normal rate and regular rhythm.  Pulmonary:     Effort: Pulmonary effort is normal.     Breath sounds: No wheezing, rhonchi or rales.  Abdominal:     General: There is no distension.     Palpations: Abdomen is soft.     Tenderness: There is no abdominal  tenderness.  Neurological:     Mental Status: She is alert.  Psychiatric:        Mood and Affect: Mood normal.  Behavior: Behavior normal.    UC Treatments / Results  Labs (all labs ordered are listed, but only abnormal results are displayed) Labs Reviewed  URINALYSIS, COMPLETE (UACMP) WITH MICROSCOPIC - Abnormal; Notable for the following components:      Result Value   APPearance CLOUDY (*)    Hgb urine dipstick SMALL (*)    Leukocytes, UA SMALL (*)    Bacteria, UA FEW (*)    All other components within normal limits  URINE CULTURE    EKG None  Radiology No results found.  Procedures Procedures (including critical care time)  Medications Ordered in UC Medications - No data to display  Initial Impression / Assessment and Plan / UC Course  I have reviewed the triage vital signs and the nursing notes.  Pertinent labs & imaging results that were available during my care of the patient were reviewed by me and considered in my medical decision making (see chart for details).    48 year old female presents with cough and dysuria. UA with WBC's and RBC's but also 6-10 squamous epithelial cells. Starting on omnicef while awaiting culture. Hycodan for cough.  Final Clinical Impressions(s) / UC Diagnoses   Final diagnoses:  Cough  Dysuria     Discharge Instructions     Medication as prescribed.  Take care  Dr. Lacinda Axon    ED Prescriptions    Medication Sig Dispense Auth. Provider   cefdinir (OMNICEF) 300 MG capsule Take 1 capsule (300 mg total) by mouth 2 (two) times daily. 14 capsule Linnell Swords G, DO   HYDROcodone-homatropine (HYCODAN) 5-1.5 MG/5ML syrup Take 5 mLs by mouth every 6 (six) hours as needed. 120 mL Coral Spikes, DO     Controlled Substance Prescriptions [place] Controlled Substance Registry consulted? Yes, I have consulted the [place] Controlled Substances Registry for this patient, and feel the risk/benefit ratio today is favorable for proceeding with  this prescription for a controlled substance.   Coral Spikes, [place] 02/16/18 1534

## 2018-02-16 NOTE — Discharge Instructions (Signed)
Medication as prescribed.  Take care  Dr. Winthrop Shannahan  

## 2018-02-16 NOTE — ED Triage Notes (Addendum)
Pt with cold sx last and has now developed a productive cough. Coughing is "violent" Pt also wants to be checked for UTI. One month of dysuria and frequency

## 2018-02-17 LAB — URINE CULTURE

## 2018-02-22 ENCOUNTER — Ambulatory Visit
Admission: EM | Admit: 2018-02-22 | Discharge: 2018-02-22 | Disposition: A | Discharge status: Home / Self Care | Payer: 59 | Attending: Family Medicine | Admitting: Family Medicine

## 2018-02-22 ENCOUNTER — Other Ambulatory Visit: Payer: Self-pay

## 2018-02-22 DIAGNOSIS — I10 Essential (primary) hypertension: Secondary | ICD-10-CM | POA: Diagnosis not present

## 2018-02-22 DIAGNOSIS — J069 Acute upper respiratory infection, unspecified: Secondary | ICD-10-CM

## 2018-02-22 MED ORDER — HYDROCOD POLST-CPM POLST ER 10-8 MG/5ML PO SUER: 5.0000 mL | Freq: Two times a day (BID) | ORAL | 0 refills | Status: DC

## 2018-02-22 MED ORDER — METHYLPREDNISOLONE 4 MG PO TBPK: ORAL_TABLET | ORAL | 0 refills | Status: DC

## 2018-02-22 NOTE — ED Provider Notes (Signed)
[facility]    CSN: [id number] Arrival date & time: 02/22/18  0932     History   Chief Complaint Chief Complaint  Patient presents with  . Cough  . Appointment    HPI Kristen Mcmahon is a 48 y.o. female.   HPI  -year-old female returns after being seen here on 02/16/2018.  At that time she presented with a cough and with a symptoms of UTI.  Placed on Hycodan for cough and for the possible UTI.  Urine culture was reviewed showing multiple species.  States that she is improved and to new with the Omnicef at this time.  Purpose of today's visit was because of the severe coughing that she is having particularly at nighttime.  It is productive.  Continues to not have fever or chills.         Past Medical History:  Diagnosis Date  . Aortic valve disorder   . Asthma   . Dysuria   . Gross hematuria   . Hypertension   . Kidney stones   . Migraines   . Reflux   . Rhinitis   . UTI (lower urinary tract infection)     Patient Active Problem List   Diagnosis Date Noted  . Autoantibody titer elevated 12/14/2014  . Asthma without status asthmaticus 12/14/2014  . Acid reflux 12/14/2014  . Breath shortness 12/14/2014  . Microscopic hematuria 11/22/2014  . UPJ (ureteropelvic junction) obstruction 11/22/2014  . Vaginal yeast infection 11/22/2014  . Dysuria 11/01/2014  . History of hematuria 11/01/2014  . HLD (hyperlipidemia) 05/23/2014  . Avitaminosis D 05/23/2014  . Headache, migraine 04/27/2014  . Pulmonary hypertension ([place]) 03/14/2011  . Fast heart beat 03/14/2011    Past Surgical History:  Procedure Laterality Date  . ABDOMINAL HYSTERECTOMY    . Hallux rigidus of left foot  2014    OB History   No obstetric history on file.      Home Medications    Prior to Admission medications   Medication Sig Start Date End Date Taking? Authorizing Provider  aspirin EC 81 MG tablet Take 81 mg by mouth daily.    [provider]  baclofen (LIORESAL)  10 MG tablet Take by mouth. 04/27/14   [provider]  cefdinir (OMNICEF) 300 MG capsule Take 1 capsule (300 mg total) by mouth 2 (two) times daily. 02/16/18   Coral Spikes, DO  chlorpheniramine-HYDROcodone (TUSSIONEX PENNKINETIC ER) 10-8 MG/5ML SUER Take 5 mLs by mouth 2 (two) times daily. 02/22/18   Lorin Picket, PA-C  cycloSPORINE (RESTASIS) 0.05 % ophthalmic emulsion Apply to eye.    [provider]  DOCOSAHEXAENOIC ACID PO Take by mouth.    [provider]  Fluticasone-Salmeterol (ADVAIR DISKUS) 250-50 MCG/DOSE AEPB 1 puff bid 04/27/14   [provider]  HYDROcodone-homatropine (HYCODAN) 5-1.5 MG/5ML syrup Take 5 mLs by mouth every 6 (six) hours as needed. 02/16/18   Coral Spikes, DO  iron polysaccharides (NIFEREX) 150 MG capsule Take by mouth. 11/05/15 11/04/16  [provider]  lamoTRIgine (LAMICTAL) 200 MG tablet Take by mouth. 04/27/14   [provider]  losartan-hydrochlorothiazide Konrad Penta) 50-12.5 MG tablet  10/30/14   [provider]  Meth-Hyo-M Bl-Na Phos-Ph Sal (URIBEL) 118 MG CAPS Take 1 capsule (118 mg total) by mouth 4 (four) times daily. 11/01/14   Zara Council A, PA-C  methylPREDNISolone (MEDROL DOSEPAK) 4 MG TBPK tablet Take per package instructions 02/22/18   Crecencio Mc P, PA-C  montelukast (SINGULAIR)  10 MG tablet Take by mouth. 04/27/14   [provider]  Omega-3 1000 MG CAPS Take by mouth.    [provider]  promethazine (PHENERGAN) 50 MG tablet  10/30/14   [provider]  simvastatin (ZOCOR) 20 MG tablet Take 20 mg by mouth daily.    [provider]  SUMAtriptan (IMITREX) 100 MG tablet Take by mouth. 04/27/14   [provider]  traMADol (ULTRAM) 50 MG tablet Take by mouth. 03/15/14   [provider]  Vitamin D, Ergocalciferol, (DRISDOL) 50000 UNITS CAPS capsule Take by mouth. 04/27/14   [provider]    Family History Family History    Problem Relation Age of Onset  . Bladder Cancer Father   . Diabetes Mellitus II Father   . Prostate cancer Father   . Kidney disease Neg Hx   . Kidney cancer Neg Hx   . Breast cancer Neg Hx     Social History Social History   Tobacco Use  . Smoking status: Former Research scientist (life sciences)  . Smokeless tobacco: Never Used  . Tobacco comment: quit 22 years  Substance Use Topics  . Alcohol use: Yes    Alcohol/week: 0.0 standard drinks    Comment: occasional  . Drug use: No     Allergies   Sulfa antibiotics and Tetracycline   Review of Systems Review of Systems  Constitutional: Positive for activity change. Negative for appetite change, chills, fatigue and fever.  HENT: Positive for congestion.   Respiratory: Positive for cough.   All other systems reviewed and are negative.    Physical Exam Triage Vital Signs ED Triage Vitals  Enc Vitals Group     BP 02/22/18 0944 (!) 142/96     Pulse Rate 02/22/18 0944 97     Resp 02/22/18 0944 18     Temp 02/22/18 0944 98.1 F (36.7 C)     Temp Source 02/22/18 0944 Oral     SpO2 02/22/18 0944 100 %     Weight 02/22/18 0946 203 lb 14.8 oz (92.5 kg)     Height 02/22/18 0946 5\' 4"  (1.626 m)     Head Circumference --      Peak Flow --      Pain Score 02/22/18 0945 5     Pain Loc --      Pain Edu? --      Excl. in [place]? --    No data found.  Updated Vital Signs BP (!) 142/96 (BP Location: Left Arm)   Pulse 97   Temp 98.1 F (36.7 C) (Oral)   Resp 18   Ht 5\' 4"  (1.626 m)   Wt 203 lb 14.8 oz (92.5 kg)   SpO2 100%   BMI 35.00 kg/m   Visual Acuity Right Eye Distance:   Left Eye Distance:   Bilateral Distance:    Right Eye Near:   Left Eye Near:    Bilateral Near:     Physical Exam Vitals signs and nursing note reviewed.  Constitutional:      General: She is not in acute distress.    Appearance: Normal appearance. She is not ill-appearing, toxic-appearing or diaphoretic.  HENT:     Head: Normocephalic.     Right Ear: Tympanic  membrane and ear canal normal.     Left Ear: Tympanic membrane and ear canal normal.     Nose: Nose normal. No congestion or rhinorrhea.     Mouth/Throat:     Mouth: Mucous membranes are moist.  Pharynx: No oropharyngeal exudate or posterior oropharyngeal erythema.  Eyes:     General:        Right eye: No discharge.        Left eye: No discharge.     Conjunctiva/sclera: Conjunctivae normal.  Neck:     Musculoskeletal: Normal range of motion.  Pulmonary:     Effort: Pulmonary effort is normal.     Breath sounds: Normal breath sounds.  Musculoskeletal: Normal range of motion.  Lymphadenopathy:     Cervical: No cervical adenopathy.  Skin:    General: Skin is warm and dry.  Neurological:     General: No focal deficit present.     Mental Status: She is alert and oriented to person, place, and time.  Psychiatric:        Mood and Affect: Mood normal.        Behavior: Behavior normal.        Thought Content: Thought content normal.        Judgment: Judgment normal.      UC Treatments / Results  Labs (all labs ordered are listed, but only abnormal results are displayed) Labs Reviewed - No data to display  EKG None  Radiology No results found.  Procedures Procedures (including critical care time)  Medications Ordered in UC Medications - No data to display  Initial Impression / Assessment and Plan / UC Course  I have reviewed the triage vital signs and the nursing notes.  Pertinent labs & imaging results that were available during my care of the patient were reviewed by me and considered in my medical decision making (see chart for details).   The patient is a continued cough particularly at nighttime, give her a trial of prednisone on a tapering dose.  Give her prescription for Tussionex for nighttime cough suppression.  She is not improving she should follow-up with her primary care physician.   Final Clinical Impressions(s) / UC Diagnoses   Final diagnoses:    Upper respiratory tract infection, unspecified type   Discharge Instructions   None    ED Prescriptions    Medication Sig Dispense Auth. Provider   methylPREDNISolone (MEDROL DOSEPAK) 4 MG TBPK tablet Take per package instructions 21 tablet Lorin Picket, PA-C   chlorpheniramine-HYDROcodone (TUSSIONEX PENNKINETIC ER) 10-8 MG/5ML SUER Take 5 mLs by mouth 2 (two) times daily. 115 mL Lorin Picket, PA-C     Controlled Substance Prescriptions [place] Controlled Substance Registry consulted? Not Applicable   Lorin Picket, PA-C 02/22/18 1109

## 2018-02-22 NOTE — ED Triage Notes (Signed)
Pt seen here on the 14th for same, persistent cough. Taking ABX and cough medication but now thinks she needs a steroid. Pain 5/10 from coughing so hard

## 2018-06-17 DIAGNOSIS — Z6835 Body mass index (BMI) 35.0-35.9, adult: Secondary | ICD-10-CM | POA: Diagnosis not present

## 2018-06-17 DIAGNOSIS — G43909 Migraine, unspecified, not intractable, without status migrainosus: Secondary | ICD-10-CM | POA: Diagnosis not present

## 2018-06-17 DIAGNOSIS — M533 Sacrococcygeal disorders, not elsewhere classified: Secondary | ICD-10-CM | POA: Diagnosis not present

## 2018-06-17 DIAGNOSIS — J4520 Mild intermittent asthma, uncomplicated: Secondary | ICD-10-CM | POA: Diagnosis not present

## 2018-06-17 DIAGNOSIS — I340 Nonrheumatic mitral (valve) insufficiency: Secondary | ICD-10-CM | POA: Diagnosis not present

## 2018-06-17 DIAGNOSIS — E559 Vitamin D deficiency, unspecified: Secondary | ICD-10-CM | POA: Diagnosis not present

## 2018-06-25 DIAGNOSIS — M533 Sacrococcygeal disorders, not elsewhere classified: Secondary | ICD-10-CM | POA: Diagnosis not present

## 2018-06-25 DIAGNOSIS — Z0000 Encounter for general adult medical examination without abnormal findings: Secondary | ICD-10-CM | POA: Diagnosis not present

## 2018-06-25 DIAGNOSIS — D72829 Elevated white blood cell count, unspecified: Secondary | ICD-10-CM | POA: Diagnosis not present

## 2018-07-06 ENCOUNTER — Other Ambulatory Visit: Payer: Self-pay

## 2018-07-06 NOTE — Progress Notes (Signed)
[facility]  [address] [place], [place] [address] Phone: [phone]  Fax: [phone]   Clinic Day:  07/07/2018  Referring physician: Tracie Harrier, MD  Chief Complaint: Kristen Mcmahon is a 48 y.o. female with a history of leukocytosis and thrombocytosis who is referred in consultation by Dr Ginette Pitman for assessment and management.   HPI:  The patient was diagnosed with thrombocytosis and leukocytosis several years ago.  She states that she has had no problems.  Counts have been monitored.  She notes frequent UTIs (4-5x/year).  She denies any history of pneumonia, sinusitis, skin infections or diarrhea.  She was on oral steroids (Medrol dose pack) for a bad URI in 02/2018.  Prior to this events, she took steroids 5 years ago.  She denies any steroid injections.  She is currently on Tramadol prn for lower back/tailbone pain. She has hypertension and hyperlipidemia.   She has been on a baby aspirin for several years. 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 Tests have not been done since, and she has never seen a hematologist.   She is followed in Urology by Zara Council, PA-C for dysuria, chronic UTIs, and microscopic hematuria.  Abdomen and pelvis CT on 05/06/2016 revealed diffuse hepatic steatosis and soft tissue fullness along the vaginal cuff (due to her supracervical hysterectomy).  There was no cause for hematuria identified. She was last seen in Urology on 06/06/2016. She undergoes a cytoscopy every 3 years (most recent 2 years ago).   Platelets have been followed: 524,000 on 08/02/2013, 459,000 on 10/05/2013, 451,000 on 04/20/2014, 472,000 on 12/07/2017, and 508,000 on 06/17/2018.  WBC has been followed: 14,000 on 08/02/2013, 13,200 on 10/05/2013, 11,300 on 04/20/2014, 11,300 on 10/23/2014, 12,500 on 04/27/2015, 12,200 on 10/29/2015, 9,900 on  04/28/2016, 13,400 on 12/03/2016, 13,800 on 06/09/2017, 13,500 on 12/07/2017, and 14,300 on 06/17/2018.   Differential has been unremarkable with predominant neutrophils.  Labs on 06/17/2018 reviewed: WBC 14,300 ([place] 9,960), hemoglobin 12.7, hematocrit 38.6, platelets 508,000.  Creatinine was 0.7, AST 30, ALT 30.  Urinalysis showed trace blood and few bacteria.  Ferritin was 158 on 12/07/2017.  Symptomatically, she reports "feeling pretty good."  She has chronic migraines and back pain. She has hot flashes and occasional sweats. She is s/p hysterectomy; she has not had any lab work done to determine menopausal status.  She has history of one pregnancy without complication.   She was trying to lose weight a few months ago and was working out regularly. However, due to the pandemic closing gyms she has gained some of the weight back.  She reports frequent UTIs, 4-5 times per year. She occasionally sees blood in her urine, sometimes with kidney stones.   She denies any blood clots. She has a family history of blood clots in her mother, who had a DVT and is currently on aspirin.  Her grandmother had leukemia.   She has arthritis in her feet, toes, and lower back. She had foot surgery 5 years ago. She does not see a rheumatologist.  She takes Advil and applies topical creams for pain.   She denies any blood in her stools but notes occasional dark stools due to oral iron.  She has been on oral iron for 2 years for iron deficiency diagnosed in 2018. Iron deficiency has improved. She was initially on oral iron once daily for 6 months, then increased to twice daily since  that time.   She has very dry eyes and uses Restasis.  Her voice is hoarse this morning, which she attributes to just taking her inhaler.    Past Medical History:  Diagnosis Date  . Aortic valve disorder   . Asthma   . Dysuria   . FOP (fibrodysplasia ossificans progressiva)   . Gross hematuria   . Hypertension   . Kidney stones   .  Migraines   . Reflux   . Rhinitis   . UTI (lower urinary tract infection)     Past Surgical History:  Procedure Laterality Date  . ABDOMINAL HYSTERECTOMY    . Hallux rigidus of left foot  2014    Family History  Problem Relation Age of Onset  . Bladder Cancer Father   . Diabetes Mellitus II Father   . Prostate cancer Father   . Hypertension Father   . Leukemia Paternal Grandmother   . Kidney disease Neg Hx   . Kidney cancer Neg Hx   . Breast cancer Neg Hx     Social History:  reports that she quit smoking about 25 years ago. She has never used smokeless tobacco. She reports current alcohol use. She reports that she does not use drugs. She drinks alcohol occasionally. She denies any known exposures to radiation or toxins. She does not work.  She lives in [place].  The patient is alone today.  Allergies:  Allergies  Allergen Reactions  . Sulfa Antibiotics Rash  . Tetracycline Other (See Comments)    Caused migraines    Current Medications: Current Outpatient Medications  Medication Sig Dispense Refill  . aspirin EC 81 MG tablet Take 81 mg by mouth daily.    . baclofen (LIORESAL) 20 MG tablet Take 20 mg by mouth 3 (three) times daily.     . cycloSPORINE (RESTASIS) 0.05 % ophthalmic emulsion Apply 1 drop to eye 2 (two) times daily.     . Fluticasone-Salmeterol (ADVAIR DISKUS) 250-50 MCG/DOSE AEPB 1 puff bid    . iron polysaccharides (NIFEREX) 150 MG capsule Take 150 mg by mouth 2 (two) times daily.     Marland Kitchen lamoTRIgine (LAMICTAL) 200 MG tablet Take 400 mg by mouth daily.     Marland Kitchen losartan-hydrochlorothiazide (HYZAAR) 50-12.5 MG tablet Take 1 tablet by mouth daily.     . montelukast (SINGULAIR) 10 MG tablet Take 10 mg by mouth at bedtime.     . Omega-3 1000 MG CAPS Take 1 capsule by mouth daily.     . promethazine (PHENERGAN) 50 MG tablet     . simvastatin (ZOCOR) 20 MG tablet Take 20 mg by mouth daily.    . SUMAtriptan (IMITREX) 100 MG tablet Take 100 mg by mouth as needed.      . traMADol (ULTRAM) 50 MG tablet Take 50 mg by mouth every 6 (six) hours as needed.     . Vitamin D, Ergocalciferol, (DRISDOL) 50000 UNITS CAPS capsule Take 50,000 Units by mouth every 7 (seven) days.      No current facility-administered medications for this visit.     Review of Systems  Constitutional: Positive for diaphoresis (occasional, hot flashes). Negative for chills, fever, malaise/fatigue and weight loss.  HENT: Negative for congestion, hearing loss, sinus pain and sore throat.        Hoarse voice.  Eyes: Negative for blurred vision.       Dry eyes.  Respiratory: Positive for cough (nonproductive) and shortness of breath (asthma). Negative for sputum production and wheezing.  Asthma.  Cardiovascular: Negative for chest pain, palpitations, claudication, leg swelling and PND.  Gastrointestinal: Positive for melena (occasional, on oral iron). Negative for abdominal pain, blood in stool, constipation, diarrhea, nausea and vomiting.  Genitourinary: Positive for hematuria (occasional). Negative for dysuria, frequency and urgency.       Frequent UTIs. Hx of kidney stones  Musculoskeletal: Positive for back pain and joint pain (arthritis in feet, toes, back). Negative for myalgias.  Skin: Negative for rash.  Neurological: Positive for headaches (chronic migraines). Negative for dizziness, tingling, sensory change and weakness.  Endo/Heme/Allergies: Does not bruise/bleed easily.  Psychiatric/Behavioral: Negative for depression and memory loss. The patient is not nervous/anxious and does not have insomnia.   All other systems reviewed and are negative.  Performance status (ECOG):  1  Physical Exam  Constitutional: She is oriented to person, place, and time. She appears well-developed and well-nourished. No distress.  HENT:  Head: Normocephalic and atraumatic.  Mouth/Throat: Oropharynx is clear and moist. No oropharyngeal exudate.  Brown hair.  Hoarse.  Eyes: Pupils are equal,  round, and reactive to light. Conjunctivae and EOM are normal. No scleral icterus.  Blue eyes.  Neck: Normal range of motion. Neck supple. No JVD present.  Cardiovascular: Normal rate, regular rhythm and normal heart sounds.  No murmur heard. Pulmonary/Chest: Effort normal and breath sounds normal. No respiratory distress. She has no wheezes. She has no rales.  Abdominal: Soft. Bowel sounds are normal. She exhibits no distension and no mass. There is no splenomegaly. There is no abdominal tenderness. There is no rebound and no guarding.  Musculoskeletal: Normal range of motion.        General: No tenderness or edema.  Lymphadenopathy:    She has no cervical adenopathy.    She has no axillary adenopathy.       Right: No supraclavicular adenopathy present.       Left: No supraclavicular adenopathy present.  Neurological: She is alert and oriented to person, place, and time.  Skin: Skin is warm and dry. No rash noted. She is not diaphoretic. No erythema. No pallor.  Psychiatric: She has a normal mood and affect. Her behavior is normal. Judgment and thought content normal.  Nursing note and vitals reviewed.   No visits with results within 3 Day(s) from this visit.  Latest known visit with results is:  Admission on 02/16/2018, Discharged on 02/16/2018  Component Date Value Ref Range Status  . Color, Urine 02/16/2018 YELLOW  YELLOW Final  . APPearance 02/16/2018 CLOUDY* CLEAR Final  . Specific Gravity, Urine 02/16/2018 1.020  1.005 - 1.030 Final  . pH 02/16/2018 7.0  5.0 - 8.0 Final  . Glucose, UA 02/16/2018 NEGATIVE  NEGATIVE mg/dL Final  . Hgb urine dipstick 02/16/2018 SMALL* NEGATIVE Final  . Bilirubin Urine 02/16/2018 NEGATIVE  NEGATIVE Final  . Ketones, ur 02/16/2018 NEGATIVE  NEGATIVE mg/dL Final  . Protein, ur 02/16/2018 NEGATIVE  NEGATIVE mg/dL Final  . Nitrite 02/16/2018 NEGATIVE  NEGATIVE Final  . Leukocytes, UA 02/16/2018 SMALL* NEGATIVE Final  . Squamous Epithelial / LPF  02/16/2018 6-10  0 - 5 Final  . WBC, UA 02/16/2018 6-10  0 - 5 WBC/hpf Final  . RBC / HPF 02/16/2018 6-10  0 - 5 RBC/hpf Final  . Bacteria, UA 02/16/2018 FEW* NONE SEEN Final   Performed at [facility], [address], [place], [place] [address]  . Specimen Description 02/16/2018    Final  Value:URINE, RANDOM Performed at [facility], [address], [place], [place] [address]   . Special Requests 02/16/2018    Final                   Value:NONE Performed at [facility], [address], [place], [place] [address]   . Culture 02/16/2018 Multiple bacterial morphotypes present, none predominant. Suggest appropriate recollection if clinically indicated.   Final  . Report Status 02/16/2018 02/17/2018 FINAL   Final    Assessment:  Kristen Mcmahon is a 48 y.o. female with mild leukocytosis and thrombocytosis.  Etiology is felt reactive.  She has asthma and is on a steroid inhaler.  She has a history of recurrent UTIs (4-5x/year).  She has arthritis.  WBC has ranged between 9,900 - 14,300 between 08/02/2013 - 06/17/2018.  Platelet count has ranged between 451,000 - 524,000 without trend from 08/02/2013 - 06/17/2018.  She has a history of iron deficiency anemia since 2018.  She is on oral iron BID.  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  She has never had a clot.  She is on a baby aspirin.  Symptomatically, she feels "pretty good".  She notes hot flashes and sweats (? pre-menopausal).  Exam reveals no adenopathy or hepatosplenomegaly.  Plan: 1.   Labs today:  CBC with diff, JAK2 with reflex, BCR-ABL, ferritin, iron studies, sed rate, CRP. 2.   Peripheral smear for path review. 3.   Mild leukocytosis and thrombocytosis  Etiology felt reactive.  She has asthma and is on Advair; recurrent UTIs, and arthritis.  Doubt a myeloproliferative disorder. 4.   History  of iron deficiency anemia  Discuss checking iron stores as iron deficiency can result in thrombocytosis. 5.   History of lupus anticoagulant and anticardiolipin antibodies  Discuss consideration of repeat testing.  Discuss risk of thrombosis. 6.   RTC in 2 weeks ([facility]) for review of work-up and discussion regarding direction of therapy.  I discussed the assessment and treatment plan with the patient.  The patient was provided an opportunity to ask questions and all were answered.  The patient agreed with the plan and demonstrated an understanding of the instructions.  The patient was advised to call back if the symptoms worsen or if the condition fails to improve as anticipated.  I provided 25 minutes of face-to-face time during this this encounter and > 50% was spent counseling as documented under my assessment and plan.    Melissa C. Mike Gip, MD, PhD    07/07/2018, 9:14 AM  I, Molly Dorshimer, am acting as Education administrator for [employer]. Mike Gip, MD, PhD.  I, Melissa C. Mike Gip, MD, have reviewed the above documentation for accuracy and completeness, and I agree with the above.

## 2018-07-07 ENCOUNTER — Telehealth: Payer: Self-pay

## 2018-07-07 ENCOUNTER — Inpatient Hospital Stay: Payer: 59

## 2018-07-07 ENCOUNTER — Encounter: Payer: Self-pay | Admitting: Hematology and Oncology

## 2018-07-07 ENCOUNTER — Inpatient Hospital Stay: Payer: 59 | Attending: Hematology and Oncology | Admitting: Hematology and Oncology

## 2018-07-07 VITALS — BP 128/83 | HR 96 | Temp 98.0°F | Resp 16 | Ht 64.0 in | Wt 225.0 lb

## 2018-07-07 DIAGNOSIS — E785 Hyperlipidemia, unspecified: Secondary | ICD-10-CM

## 2018-07-07 DIAGNOSIS — D6862 Lupus anticoagulant syndrome: Secondary | ICD-10-CM

## 2018-07-07 DIAGNOSIS — I10 Essential (primary) hypertension: Secondary | ICD-10-CM

## 2018-07-07 DIAGNOSIS — D509 Iron deficiency anemia, unspecified: Secondary | ICD-10-CM | POA: Diagnosis not present

## 2018-07-07 DIAGNOSIS — D473 Essential (hemorrhagic) thrombocythemia: Secondary | ICD-10-CM | POA: Diagnosis present

## 2018-07-07 DIAGNOSIS — D72829 Elevated white blood cell count, unspecified: Secondary | ICD-10-CM

## 2018-07-07 DIAGNOSIS — D75839 Thrombocytosis, unspecified: Secondary | ICD-10-CM | POA: Insufficient documentation

## 2018-07-07 DIAGNOSIS — Z87440 Personal history of urinary (tract) infections: Secondary | ICD-10-CM | POA: Diagnosis not present

## 2018-07-07 DIAGNOSIS — Z7982 Long term (current) use of aspirin: Secondary | ICD-10-CM | POA: Diagnosis not present

## 2018-07-07 LAB — IRON AND TIBC
Iron: 67 ug/dL (ref 28–170)
Saturation Ratios: 19 % (ref 10.4–31.8)
TIBC: 349 ug/dL (ref 250–450)
UIBC: 282 ug/dL

## 2018-07-07 LAB — CBC WITH DIFFERENTIAL/PLATELET
Abs Immature Granulocytes: 0.08 10*3/uL — ABNORMAL HIGH (ref 0.00–0.07)
Basophils Absolute: 0.1 10*3/uL (ref 0.0–0.1)
Basophils Relative: 1 %
Eosinophils Absolute: 0.3 10*3/uL (ref 0.0–0.5)
Eosinophils Relative: 2 %
HCT: 39.5 % (ref 36.0–46.0)
Hemoglobin: 13.3 g/dL (ref 12.0–15.0)
Immature Granulocytes: 1 %
Lymphocytes Relative: 15 %
Lymphs Abs: 2.5 10*3/uL (ref 0.7–4.0)
MCH: 30.4 pg (ref 26.0–34.0)
MCHC: 33.7 g/dL (ref 30.0–36.0)
MCV: 90.2 fL (ref 80.0–100.0)
Monocytes Absolute: 0.8 10*3/uL (ref 0.1–1.0)
Monocytes Relative: 5 %
Neutro Abs: 12.6 10*3/uL — ABNORMAL HIGH (ref 1.7–7.7)
Neutrophils Relative %: 76 %
Platelets: 464 10*3/uL — ABNORMAL HIGH (ref 150–400)
RBC: 4.38 MIL/uL (ref 3.87–5.11)
RDW: 13 % (ref 11.5–15.5)
WBC: 16.3 10*3/uL — ABNORMAL HIGH (ref 4.0–10.5)
nRBC: 0 % (ref 0.0–0.2)

## 2018-07-07 LAB — C-REACTIVE PROTEIN: CRP: 3.1 mg/dL — ABNORMAL HIGH (ref <1.0)

## 2018-07-07 LAB — FERRITIN: Ferritin: 202 ng/mL (ref 11–307)

## 2018-07-07 LAB — SEDIMENTATION RATE: Sed Rate: 77 mm/hr — ABNORMAL HIGH (ref 0–20)

## 2018-07-07 NOTE — Telephone Encounter (Signed)
-----   Message from Lequita Asal, MD sent at 07/07/2018  4:01 PM EDT ----- Regarding: Please call patient  OK to stop oral iron.  M ----- Message ----- From: Buel Ream, [facility] [place] Sent: 07/07/2018  10:05 AM EDT To: Lequita Asal, MD

## 2018-07-07 NOTE — Progress Notes (Signed)
Pt here as new patient referral from Dr. Ginette Pitman for elevated WBC. Denies any concerns at this time.

## 2018-07-07 NOTE — Telephone Encounter (Signed)
Spoke with the patient to inform her, Per Dr Mike Gip it is ok to stop the oral Iron. The patient was understanding and agreeable.

## 2018-07-08 LAB — PATHOLOGIST SMEAR REVIEW

## 2018-07-15 LAB — BCR-ABL1 FISH
Cells Analyzed: 200
Cells Counted: 200

## 2018-07-19 LAB — CALR + JAK2 E12-15 + MPL (REFLEXED)

## 2018-07-19 LAB — JAK2 V617F, W REFLEX TO CALR/E12/MPL

## 2018-07-20 ENCOUNTER — Telehealth: Payer: Self-pay | Admitting: Hematology and Oncology

## 2018-07-20 NOTE — Progress Notes (Signed)
[facility]  [address] [place], [place] [address] Phone: [phone]  Fax: [phone]   Telemedicine Office Visit:  07/21/2018  Referring physician: Tracie Harrier, MD  I connected with [name] on 07/21/2018 at 11:50 AM by videoconferencing and verified that I was speaking with the correct person using 2 identifiers.  The patient was at home.  I discussed the limitations, risk, security and privacy concerns of performing an evaluation and management service by videoconferencing and the availability of in person appointments.  I also discussed with the patient that there may be a patient responsible charge related to this service.  The patient expressed understanding and agreed to proceed.   Chief Complaint: Kristen Mcmahon is a 48 y.o. female with a history of leukocytosis and thrombocytosis who is seen for review of workup and discussion regarding direction of therapy.   HPI: The patient was last seen in the hematology clinic on 07/07/2018 for initial consultation.  At that time, she felt "pretty good".  She noted hot flashes and sweats (? pre-menopausal).  Exam revealed no adenopathy or hepatosplenomegaly. She was felt to have reactive leukocytosis secondary to asthma, history of recurrent UTIs and arthritis.   Work-up on 07/07/2018 revealed:  WBC 16,300 (ANC 12,600), hemoglobin 13.3, hematocrit 39.5, platelets 464,000. Iron saturation 19%, ferritin 202. BCR-ABL was negative. CRP 3.1 (elevated). Sed rate 77 (elevated).  JAK2 V617F, exon 12-15, CALR, and MPL negative.  Peripheral smear revealed absolute leukocytosis with unremarkable differential and morphology, normochromic, normocytic erythrocytes with normal RBC parameters, and thrombocytosis with only rare giant platelets.   She had been on oral iron for 2 years.  Oral iron was discontinued.  Bilateral screening mammogram on 07/09/2018 revealed possible asymmetry in the left breast.  Mammogram and ultrasound on 07/18/2018 revealed no mammographic or sonographic evidence of malignancy in the left breast or axilla.   During the interim, the patient is doing "good." She reports a lower back ache.    Past Medical History:  Diagnosis Date  . Aortic valve disorder   . Asthma   . Dysuria   . FOP (fibrodysplasia ossificans progressiva)   . Gross hematuria   . Hypertension   . Kidney stones   . Migraines   . Reflux   . Rhinitis   . UTI (lower urinary tract infection)     Past Surgical History:  Procedure Laterality Date  . ABDOMINAL HYSTERECTOMY    . Hallux rigidus of left foot  2014    Family History  Problem Relation Age of Onset  . Bladder Cancer Father   . Diabetes Mellitus II Father   . Prostate cancer Father   . Hypertension Father   . Leukemia Paternal Grandmother   . Kidney disease Neg Hx   . Kidney cancer Neg Hx   . Breast cancer Neg Hx     Social History:  reports that she quit smoking about 25 years ago. She has never used smokeless tobacco. She reports current alcohol use. She reports that she does not use drugs. She drinks alcohol occasionally. She denies any known exposures to radiation or toxins. She does not work.  She lives in [place]. The patient is alone today.  Participants in the patient's visit and their role in the encounter included the patient and Vito Berger, CMA, today.  The intake visit was provided by Vito Berger, CMA.  Allergies:  Allergies  Allergen Reactions  . Sulfa Antibiotics Rash  . Tetracycline Other (See Comments)  Caused migraines    Current Medications: Current Outpatient Medications  Medication Sig Dispense Refill  . aspirin EC 81 MG tablet Take 81 mg by mouth daily.    . baclofen (LIORESAL) 20 MG tablet Take 20 mg by mouth 3 (three) times daily.     . cycloSPORINE (RESTASIS) 0.05 % ophthalmic emulsion Apply 1 drop to eye 2 (two) times daily.     . Fluticasone-Salmeterol (ADVAIR DISKUS) 250-50  MCG/DOSE AEPB 1 puff bid    . lamoTRIgine (LAMICTAL) 200 MG tablet Take 400 mg by mouth daily.     Marland Kitchen losartan-hydrochlorothiazide (HYZAAR) 50-12.5 MG tablet Take 1 tablet by mouth daily.     . montelukast (SINGULAIR) 10 MG tablet Take 10 mg by mouth at bedtime.     . Omega-3 1000 MG CAPS Take 1 capsule by mouth daily.     . simvastatin (ZOCOR) 20 MG tablet Take 20 mg by mouth daily.    . SUMAtriptan (IMITREX) 100 MG tablet Take 100 mg by mouth as needed.     . traMADol (ULTRAM) 50 MG tablet Take 50 mg by mouth every 6 (six) hours as needed.     . Vitamin D, Ergocalciferol, (DRISDOL) 50000 UNITS CAPS capsule Take 50,000 Units by mouth every 7 (seven) days.     . iron polysaccharides (NIFEREX) 150 MG capsule Take 150 mg by mouth 2 (two) times daily.     . promethazine (PHENERGAN) 50 MG tablet      No current facility-administered medications for this visit.     Review of Systems  Constitutional: Positive for diaphoresis (occasional, hot flashes). Negative for chills, fever, malaise/fatigue and weight loss.       Doing "good."  HENT: Negative for congestion, hearing loss, sinus pain and sore throat.        Hoarse voice.  Eyes: Negative for blurred vision, double vision and pain.       Dry eyes.  Respiratory: Positive for cough (nonproductive) and shortness of breath (asthma). Negative for sputum production and wheezing.        Asthma.  Cardiovascular: Negative.  Negative for chest pain, palpitations, claudication, leg swelling and PND.  Gastrointestinal: Positive for melena (occasional, on oral iron). Negative for abdominal pain, blood in stool, constipation, diarrhea, nausea and vomiting.  Genitourinary: Positive for hematuria (occasional). Negative for dysuria, frequency and urgency.       Frequent UTIs. Hx of kidney stones  Musculoskeletal: Positive for back pain and joint pain (arthritis in feet, toes, back). Negative for myalgias.  Skin: Negative for rash.  Neurological: Positive for  headaches (chronic migraines). Negative for dizziness, tingling, sensory change, focal weakness and weakness.  Endo/Heme/Allergies: Negative.  Does not bruise/bleed easily.  Psychiatric/Behavioral: Negative.  Negative for depression and memory loss. The patient is not nervous/anxious and does not have insomnia.   All other systems reviewed and are negative.   Performance status (ECOG): 1  Physical Exam  Constitutional: She is oriented to person, place, and time. She appears well-developed and well-nourished. No distress.  HENT:  Head: Normocephalic and atraumatic.  Brown hair.  Hoarse.  Eyes: Conjunctivae and EOM are normal. No scleral icterus.  Glasses.  Blue eyes.  Abdominal: There is no splenomegaly.  Neurological: She is alert and oriented to person, place, and time.  Skin: No pallor.  Psychiatric: She has a normal mood and affect. Her behavior is normal. Judgment and thought content normal.  Nursing note reviewed.   No visits with results within 3 Day(s) from this  visit.  Latest known visit with results is:  Office Visit on 07/07/2018  Component Date Value Ref Range Status  . Path Review 07/07/2018 Peripheral blood smear is reviewed.   Final   Comment: Absolute leukocytosis with unremarkable differential and morphology. Normochromic, normocytic erythrocytes with normal RBC parameters. Thrombocytosis with only rare giant platelets. The cause for patients elevated counts is unclear from morphologic evaluation. Potential etiologies may include drug reaction, infection, and rheumatologic conditions. A myeloproliferative process may also be within the differential, but would require bone marrow evaluation or testing for mutations of JAK2, CALR, or MPL. Clinical correlation is recommended. Reviewed by Kathi Simpers, M.D. Performed at [facility], [address], [place], [place] [address]   . Specimen Type 07/07/2018 BLOOD   Final  . Cells Counted 07/07/2018 200    Final  . Cells Analyzed 07/07/2018 200   Final  . FISH Result 07/07/2018 Comment:   Final   NORMAL:  NO BCR OR ABL1 GENE REARRANGEMENT OBSERVED  . Interpretation 07/07/2018 Comment:   Final   Comment: (NOTE)             nuc ish 9q34(ASS1,ABL1)x2,22q11.2(BCRx2)[200].      The fluorescence in situ hybridization (FISH) study was normal. FISH, using unique sequence DNA probes for the ABL1 and BCR gene regions showed two ABL1 signals (red), two control ASS1 gene signals (aqua) located adjacent to the ABL1 locus at 9q34, and two BCR signals (green) at 22q11.2 in all interphase nuclei examined. There was NO evidence of CML or ALL-associated BCR/ABL1 dual fusion signals in this analysis. .      This analysis is limited to abnormalities detectable by the specific probes included in the study. FISH results should be interpreted within the context of a full cytogenetic analysis and pathology evaluation. .      This test was developed and its performance characteristics determined by [employer]). It has not been cleared or approved by the U.S. Food and Drug Administration. A BCR-ABL1 gene fusion in greater than 3 interphase nuclei in a                           patient with a new clinical diagnosis is considered positive. The DNA probe vendor for this study was [employer], research (physical sciences)).   . Director Review: 07/07/2018 Comment:   Final   Comment: (NOTE) Kirby Crigler, PHD, [place] Performed At: [facility] [address] [place], [place] [id number] Katina Degree MDPhD [id number] Performed At: [facility] [address] [place], [place] [id number] Rush Farmer MD [id number]   . Iron 07/07/2018 67  28 - 170 ug/dL Final  . TIBC 07/07/2018 349  250 - 450 ug/dL Final  . Saturation Ratios 07/07/2018 19  10.4 - 31.8 % Final  . UIBC 07/07/2018 282  ug/dL Final   Performed at [facility], [address]  [place], [place] [address]  . Ferritin 07/07/2018 202  11 - 307 ng/mL Final   Performed at [facility], [address] [place], [place] [address]  . WBC 07/07/2018 16.3* 4.0 - 10.5 K/uL Final  . RBC 07/07/2018 4.38  3.87 - 5.11 MIL/uL Final  . Hemoglobin 07/07/2018 13.3  12.0 - 15.0 g/dL Final  . HCT 07/07/2018 39.5  36.0 - 46.0 % Final  . MCV 07/07/2018 90.2  80.0 - 100.0 fL Final  . MCH 07/07/2018 30.4  26.0 - 34.0  pg Final  . MCHC 07/07/2018 33.7  30.0 - 36.0 g/dL Final  . RDW 07/07/2018 13.0  11.5 - 15.5 % Final  . Platelets 07/07/2018 464* 150 - 400 K/uL Final  . nRBC 07/07/2018 0.0  0.0 - 0.2 % Final  . Neutrophils Relative % 07/07/2018 76  % Final  . Neutro Abs 07/07/2018 12.6* 1.7 - 7.7 K/uL Final  . Lymphocytes Relative 07/07/2018 15  % Final  . Lymphs Abs 07/07/2018 2.5  0.7 - 4.0 K/uL Final  . Monocytes Relative 07/07/2018 5  % Final  . Monocytes Absolute 07/07/2018 0.8  0.1 - 1.0 K/uL Final  . Eosinophils Relative 07/07/2018 2  % Final  . Eosinophils Absolute 07/07/2018 0.3  0.0 - 0.5 K/uL Final  . Basophils Relative 07/07/2018 1  % Final  . Basophils Absolute 07/07/2018 0.1  0.0 - 0.1 K/uL Final  . Immature Granulocytes 07/07/2018 1  % Final  . Abs Immature Granulocytes 07/07/2018 0.08* 0.00 - 0.07 K/uL Final   Performed at [facility], [address], [place], [place] [address]  . CRP 07/07/2018 3.1* <1.0 mg/dL Final   Performed at [facility] [address] [place], [place] [address]  . Sed Rate 07/07/2018 77* 0 - 20 mm/hr Final   Performed at [facility] [place] [facility], [address], [place], [place] [address]  . JAK2 GenotypR 07/07/2018 Comment   Final   Comment: (NOTE) Result: NEGATIVE for the JAK2 V617F mutation. Interpretation:  The G to T nucleotide change encoding the V617F mutation was not detected.  This result does not rule out the presence of the JAK2 mutation at a level below the sensitivity of detection of this assay, or  the presence of other mutations within JAK2 not detected by this assay.  This result does not rule out a diagnosis of polycythemia vera, essential thrombocythemia or idiopathic myelofibrosis as the V617F mutation is not detected in all patients with these disorders.   Marland Kitchen BACKGROUND: 07/07/2018 Comment   Final   Comment: (NOTE) JAK2 is a cytoplasmic tyrosine kinase with a key role in signal transduction from multiple hematopoietic growth factor receptors. A point mutation within exon 14 of the JAK2 gene ([id number]) encoding a valine to phenylalanine substitution at position 617 of the JAK2 protein (V617F) has been identified in most patients with polycythemia vera, and in about half of those with either essential thrombocythemia or idiopathic myelofibrosis. The V617F has also been detected, although infrequently, in other myeloid disorders such as chronic myelomonocytic leukemia and chronic neutrophilic luekemia. V617F is an acquired mutation that alters a highly conserved valine present in the negative regulatory JH2 domain of the JAK2 protein and is predicted to dysregulate kinase activity. Methodology: Total genomic DNA was extracted and subjected to TaqMan real-time PCR amplification/detection. Two amplification products per sample were monitored by real-time PCR using primers/probes s                          pecific to JAK2 wild type (WT) and JAK2 mutant V617F. The ABI7900 Absolute Quantitation software will compare the patient specimen valuse to the standard curves and generate percent values for wild type and mutant type. In vitro studies have indicated that this assay has an analytical sensitivity of 1%. References: Baxter EJ, Scott Phineas Real, et al. Acquired mutation of the tyrosine kinase JAK2 in human myeloproliferative disorders. Lancet. 2005 Mar 19-25; 365(9464):1054-1061. Alfonso Ramus Couedic JP. A  unique clonal JAK2 mutation leading to constitutive signaling  causes polycythaemia vera. Nature. 2005 Apr 28; 434(7037):1144-1148. Kralovics R, Passamonti F, Buser AS, et al. A gain-of-function mutation of JAK2 in myeloproliferative disorders. N Engl J Med. 2005 Apr 28; 352(17):1779-1790.   . Director Review, JAK2 07/07/2018 Comment   Final   Comment: (NOTE) Constance Goltz, PhD, [facility]               Director, [place] for [place], [place]               [phone] This test was developed and its performance characteristics determined by [facility]. It has not been cleared or approved by the Food and Drug Administration.   Marland Kitchen REFLEX: 07/07/2018 Comment   Final   Comment: (NOTE) Reflex to CALR Mutation Analysis, JAK2 Exon 12-15 Mutation Analysis, and MPL Mutation Analysis is indicated.   Marland Kitchen Extraction 07/07/2018 Completed   Corrected   Comment: (NOTE) Performed At: [facility] [address] [place], [place] [id number] Katina Degree MDPhD [id number] Performed At: [facility] [place], [place] [id number] Katina Degree MDPhD [id number]   . CALR Mutation Detection Result 07/07/2018 Comment   Final   Comment: (NOTE) NEGATIVE No insertions or deletions were detected within the analyzed region of the calreticulin (CALR) gene. A negative result does not entirely exclude the possibility of a clonal population carrying CALR gene mutations that are not covered by this assay. Results should be interpreted in conjunction with clinical and laboratory findings for the most accurate interpretation.   . Background: 07/07/2018 Comment   Final   Comment: (NOTE) The calcium-binding endoplasmic reticulin chaperone protein, calreticulin (CALR), is somatically mutated in approximately 70% of patients with JAK2-negative essential thrombocythemia (ET) and 60- 88% of patients with JAK2-negative primary myelofibrosis(PMF). Only a  minority of patients (approximately 8%) with myelodysplasia have mutations in  CALR gene. CALR mutations are rarely detected in patients with de novo acute myeloid leukemia, chronic myelogenous leukemia, lymphoid leukemia, or solid tumors. CALR mutations are not detected in polycythemia and generally appear to be mutually exclusive with JAK2 mutations and MPL mutations. The majority of mutational changes involve a variety of insertion or deletion mutations in exon 9 of the calreticulin gene: approximately 53% of all CALR mutations are a 52 bp deletion (type-1) while the second most prevalent mutation (approximately 32%) contains a 5 bp insertion (type-2). Other mutations (non-type 1 or type 2) are seen                           in a small minority of cases. CALR mutations in PMF tend to be associated with a favorable prognosis compared to JAK2 V617F mutations, whereas primary myelofibrosis negative for CALR, JAK2 V617F and MPL mutations (so-called triple negative) is associated with a poor prognosis and shorter survival. The detection of a CALR gene mutation aids in the specific diagnosis of a myeloproliferative neoplasm, and help distinguish this clonal disease from a benign reactive process.   . Methodology: 07/07/2018 Comment   Final   Comment: (NOTE) Genomic DNA was isolated from the provided specimen. Polymerase chain reaction (PCR) of exon 9 of  the CALR gene was performed with specific fluorescent-labeled primers, and the PCR product was analyzed by capillary gel electrophoresis to determine the size of the PCR products. This PCR assay is capable of detecting a mutant cell population with a sensitivity of 5 mutant cells per 100 normal cells. A negative result does not exclude the presence of a myeloproliferative disorder or other neoplastic process. This test was developed and its performance characteristics determined by [facility]. It has not been cleared or approved by the  Food and Drug Administration. The FDA has determined that such clearance or approval is not necessary.   . References: 07/07/2018 Comment   Final   Comment: (NOTE) 1. Klampfel, T. et al. (2013) Somatic mutations of calreticulin in   myeloproliferative neoplasms. New Engl. J. Med. 244:0102-7253. 2. Haynes Kerns et al. (2013) Somatic CALR mutations in   myeloproliferative neoplasms with nonmutated JAK2. New Engl. J.   Med. [phone].   Marland Kitchen Director Review 07/07/2018 Comment   Final   Comment: (NOTE) Constance Goltz, PhD, [facility]               Director, [place] for [place] and [place], [place]   . JAK2 Exons 12-15 Mut Det PCR: 07/07/2018 Comment   Final   Comment: (NOTE) NEGATIVE JAK2 mutations were not detected in exons 12, 13, 14 and 15. This result does not rule out the presence of JAK2 mutation at a level below the detection sensitivity of this assay, the presence of other mutations outside the analyzed region of the JAK2 gene, or the presence of a myeloproliferative or other neoplasm. Result must be correlated with other clinical data for the most accurate diagnosis.   Marland Kitchen BACKGROUND: 07/07/2018 Comment   Final   Comment: (NOTE) JAK2 V617F mutation is detected in patients with polycythemia vera (95%), essential thrombocythemia (50%) and primary myelofibrosis (50%). A small percentage of JAK2 mutation positive patients (3.3%) contain other non-V617F mutations within exons 12 to 15. The detection of a JAK2 gene mutation aids in the specific diagnosis of a myeloproliferative neoplasm, and help distinguish this clonal disease from a benign reactive process.   . Method 07/07/2018 Comment   Final   Comment: (NOTE) Total RNA was purified from the provided specimen. The JAK2 gene region covering exons 12 to 15 was subjected to reverse- transcription coupled PCR  amplification, and bi-directional sequencing to identify sequence variations. This assay has a sensitivity to detect approximately 15% population of cells containing the JAK2 mutations in a background of non-mutant cells. This test was developed and its performance characteristics determined by [facility]. It has not been cleared or approved by the Food and Drug Administration.   . References 07/07/2018 Comment   Final   Comment: (NOTE) Algasham, N. et al. Detection of mutations in JAK2 exons 12-15 by Sanger sequencing. Int J Lab Hemato. 2015, 38:34-41. Joelene Millin al. Mutation profile of JAK2 transcripts in patients with chronic myeloproliferative neoplasias. J Mol Diagn. 2009, 11:49-53.   Marland Kitchen DIRECTOR REVIEW: 07/07/2018 Comment   Final   Comment: (NOTE) Kirby Crigler, PhD, [place] Associate Technical Director, [place] for  Engineer, building services / Pathology   [facility]) [place], [place],   [place], [place]   [phone]   . MPL MUTATION ANALYSIS RESULT: 07/07/2018 Comment   Final   Comment: (NOTE) No MPL mutation was identified in the provided specimen of this individual. Results should be interpreted in conjunction with clinical and other laboratory findings for the most accurate interpretation.   Marland Kitchen BACKGROUND: 07/07/2018 Comment   Final   Comment: (NOTE) MPL (myeloproliferative leukemia virus oncogene homology) belongs to the hematopoietin superfamily and enables its ligand thrombopoietin to facilitate both global hematopoiesis and megakaryocyte growth and differentiation. MPL W515 mutations are present in patients with primary myelofibrosis (PMF) and essential thrombocythemia (ET) at a frequency of approximately 5% and 1% respectively. The S505 mutation is detected in patients with hereditary thrombocythemia.   Marland Kitchen METHODOLOGY: 07/07/2018 Comment   Final   Comment:  (NOTE) Genomic DNA was purified from the provided specimen. MPL gene region covering the S505N and W515L/K mutations were subjected to PCR amplification and bi-directional sequencing in duplicate to identify sequence variations. This assay has a sensitivity to detect approximately 20-25% population of cells containing the MPL mutations in a background of non-mutant cells. This assay will not detect the mutation below the sensitivity of this assay. Molecular- based testing is highly accurate, but as in any laboratory test, rare diagnostic errors may occur.   Marland Kitchen REFERENCES: 07/07/2018 Comment   Final   Comment: (NOTE) 1. Pardanani AD, et al. (2006). MPL515 mutations in   myeloproliferative and other myeloid disorders: a study   of 1182 patients. Blood 921:1941-7408. 2. Andre Lefort and Levine RL. (2008). JAK2 and MPL   mutations in myeloproliferative neoplasms: discovery and   science. Leukemia 22:1813-1817. 3. Juline Patch, et al. (2009). Evidence for a founder effect   of the MPL-S505N mutation in eight [place] pedigrees with   hereditary thrombocythemia. Haematologica 94(10):1368-   1448.   Marland Kitchen DIRECTOR REVIEW: 07/07/2018 Comment   Final   Comment: (NOTE) Kirby Crigler, PhD, [place] Associate Technical Director, [place] for Molecular Biology / Pathology   [place]) [place], [place],   [place], [place]   [phone] This test was developed and its performance characteristics determined by [facility]. It has not been cleared or approved by the Food and Drug Administration.   . Extraction 07/07/2018 Comment   Final   Comment: (NOTE) This sample has been received and DNA extraction has been performed. Performed At: [facility] [address] [place], [place] [id number] Katina Degree MDPhD [id number] Performed At: [facility] [address] [place],  [place] [id number] Katina Degree MDPhD [id number]     Assessment:  Kristen Mcmahon is a 48 y.o. female with mild leukocytosis and thrombocytosis.  Etiology is felt reactive.  She has asthma and is on a steroid inhaler.  She has a history of recurrent UTIs (4-5x/year).  She has arthritis.  WBC has ranged between 9,900 - 14,300 between 08/02/2013 - 06/17/2018.  Platelet count has ranged between 451,000 - 524,000 without trend from 08/02/2013 - 06/17/2018.  Work-up on 07/07/2018 revealed a WBC 16,300 (ANC 12,600), hemoglobin 13.3, hematocrit 39.5, platelets 464,000.  Iron saturation was 19%.  Ferritin was 202.  Sed rate (77) and CRP (3.1) were elevated.  BCR-ABL, JAK2 V617F, exon 12-15, CALR, and MPL were negative.  Peripheral smear  revealed absolute leukocytosis with unremarkable differential and morphology, normochromic, normocytic erythrocytes with normal RBC parameters, and thrombocytosis with only rare giant platelets.   She has a history of iron deficiency anemia since 2018.  She is on oral iron BID.  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  She has never had a clot.  She is on a baby aspirin.  Symptomatically, she has felt good.  She denies any B symptoms.  Plan: 1.   Review workup 2.   Mild leukocytosis and thrombocytosis             CBC revealed a white count of 16,300 with an ANC of 12,600.    Differential was unremarkable.    Review of peripheral smear revealed unremarkable differential and morphology.    Additional testing revealed no evidence of a myeloproliferative disorder.    Etiology appears reactive.    Reassurance provided.    Discuss follow-up as needed if any concern with unexplained rising counts or symptoms. 3.   History of iron deficiency anemia             Hematocrit 39.5.  Hemoglobin 13.3.  MCV 90.2.  Ferritin 202 with an iron saturation of 19%.    Markers of inflammation are elevated with a  sed rate of 77 and a C-reactive protein of 3.1.   4.   History of lupus anticoagulant and anticardiolipin antibodies             Consider follow-up testing.  If patient would develop a clot, she would require long-term anticoagulation. 5.   RTC prn.  I discussed the assessment and treatment plan with the patient.  The patient was provided an opportunity to ask questions and all were answered.  The patient agreed with the plan and demonstrated an understanding of the instructions.  The patient was advised to call back or seek an in person evaluation if the symptoms worsen or if the condition fails to improve as anticipated.  I provided 12 minutes (11:50 AM - 12:02 PM) of face-to-face video visit time during this this encounter and > 50% was spent counseling as documented under my assessment and plan.  I provided these services from the [facility] office.   Nolon Stalls, MD, PhD  07/21/2018, 11:50 AM  I, Molly Dorshimer, am acting as Education administrator for [employer]. Mike Gip, MD, PhD.  I,  C. Mike Gip, MD, have reviewed the above documentation for accuracy and completeness, and I agree with the above.

## 2018-07-21 ENCOUNTER — Other Ambulatory Visit: Payer: Self-pay

## 2018-07-21 ENCOUNTER — Inpatient Hospital Stay (HOSPITAL_BASED_OUTPATIENT_CLINIC_OR_DEPARTMENT_OTHER): Payer: 59 | Admitting: Hematology and Oncology

## 2018-07-21 ENCOUNTER — Encounter: Payer: Self-pay | Admitting: Hematology and Oncology

## 2018-07-21 DIAGNOSIS — D72829 Elevated white blood cell count, unspecified: Secondary | ICD-10-CM

## 2018-07-21 DIAGNOSIS — D473 Essential (hemorrhagic) thrombocythemia: Secondary | ICD-10-CM

## 2018-07-21 DIAGNOSIS — D75839 Thrombocytosis, unspecified: Secondary | ICD-10-CM

## 2018-07-21 NOTE — Progress Notes (Signed)
No new changes noted today. The patient Name and DOB has been verified by phone today. 

## 2018-08-17 ENCOUNTER — Other Ambulatory Visit: Payer: Self-pay | Admitting: Obstetrics and Gynecology

## 2018-08-25 ENCOUNTER — Other Ambulatory Visit: Payer: Self-pay | Admitting: Obstetrics and Gynecology

## 2018-08-25 DIAGNOSIS — Z1231 Encounter for screening mammogram for malignant neoplasm of breast: Secondary | ICD-10-CM

## 2018-09-08 ENCOUNTER — Other Ambulatory Visit: Payer: Self-pay

## 2018-09-08 ENCOUNTER — Ambulatory Visit
Admission: RE | Admit: 2018-09-08 | Discharge: 2018-09-08 | Disposition: A | Discharge status: Home / Self Care | Payer: 59 | Source: Ambulatory Visit | Attending: Obstetrics and Gynecology | Admitting: Obstetrics and Gynecology

## 2018-09-08 DIAGNOSIS — Z1231 Encounter for screening mammogram for malignant neoplasm of breast: Secondary | ICD-10-CM | POA: Diagnosis present

## 2018-09-15 ENCOUNTER — Other Ambulatory Visit: Payer: Self-pay | Admitting: Obstetrics and Gynecology

## 2018-09-15 DIAGNOSIS — R921 Mammographic calcification found on diagnostic imaging of breast: Secondary | ICD-10-CM

## 2018-09-15 DIAGNOSIS — R928 Other abnormal and inconclusive findings on diagnostic imaging of breast: Secondary | ICD-10-CM

## 2018-09-20 ENCOUNTER — Ambulatory Visit
Admission: RE | Admit: 2018-09-20 | Discharge: 2018-09-20 | Disposition: A | Discharge status: Home / Self Care | Payer: 59 | Source: Ambulatory Visit | Attending: Obstetrics and Gynecology | Admitting: Obstetrics and Gynecology

## 2018-09-20 ENCOUNTER — Other Ambulatory Visit: Payer: Self-pay

## 2018-09-20 DIAGNOSIS — R921 Mammographic calcification found on diagnostic imaging of breast: Secondary | ICD-10-CM | POA: Diagnosis present

## 2018-09-20 DIAGNOSIS — R928 Other abnormal and inconclusive findings on diagnostic imaging of breast: Secondary | ICD-10-CM | POA: Insufficient documentation

## 2018-10-06 ENCOUNTER — Other Ambulatory Visit: Payer: Self-pay | Admitting: Obstetrics and Gynecology

## 2018-10-06 DIAGNOSIS — R921 Mammographic calcification found on diagnostic imaging of breast: Secondary | ICD-10-CM

## 2018-10-06 DIAGNOSIS — R928 Other abnormal and inconclusive findings on diagnostic imaging of breast: Secondary | ICD-10-CM

## 2019-03-30 ENCOUNTER — Ambulatory Visit
Admission: RE | Admit: 2019-03-30 | Discharge: 2019-03-30 | Disposition: A | Discharge status: Home / Self Care | Payer: 59 | Source: Ambulatory Visit | Attending: Obstetrics and Gynecology | Admitting: Obstetrics and Gynecology

## 2019-03-30 DIAGNOSIS — R928 Other abnormal and inconclusive findings on diagnostic imaging of breast: Secondary | ICD-10-CM | POA: Diagnosis present

## 2019-03-30 DIAGNOSIS — R921 Mammographic calcification found on diagnostic imaging of breast: Secondary | ICD-10-CM | POA: Diagnosis present

## 2019-04-04 ENCOUNTER — Other Ambulatory Visit: Payer: Self-pay | Admitting: Obstetrics and Gynecology

## 2019-04-04 DIAGNOSIS — R921 Mammographic calcification found on diagnostic imaging of breast: Secondary | ICD-10-CM

## 2019-04-09 ENCOUNTER — Ambulatory Visit
Admission: EM | Admit: 2019-04-09 | Discharge: 2019-04-09 | Disposition: A | Discharge status: Home / Self Care | Payer: 59 | Attending: Urgent Care | Admitting: Urgent Care

## 2019-04-09 ENCOUNTER — Encounter: Payer: Self-pay | Admitting: Emergency Medicine

## 2019-04-09 ENCOUNTER — Other Ambulatory Visit: Payer: Self-pay

## 2019-04-09 DIAGNOSIS — H60322 Hemorrhagic otitis externa, left ear: Secondary | ICD-10-CM

## 2019-04-09 DIAGNOSIS — H9202 Otalgia, left ear: Secondary | ICD-10-CM

## 2019-04-09 MED ORDER — NEOMYCIN-POLYMYXIN-HC 3.5-10000-1 OT SUSP: 4.0000 [drp] | Freq: Three times a day (TID) | OTIC | 0 refills | Status: DC

## 2019-04-09 NOTE — ED Triage Notes (Signed)
Patient states that she had put a Q-tip in her left ear and it started bleeding and had pain in the left last night.  Patient denies fevers.

## 2019-04-09 NOTE — ED Provider Notes (Signed)
[facility] [place]   Name: Kristen Mcmahon DOB: 10-Jan-1971 MRN: [id number] CSN: [id number] PCP: Tracie Harrier, MD  Arrival date and time:  04/09/19 0803  Chief Complaint:  Otalgia   NOTE: Prior to seeing the patient today, I have reviewed the triage nursing documentation and vital signs. Clinical staff has updated patient's PMH/PSHx, current medication list, and drug allergies/intolerances to ensure comprehensive history available to assist in medical decision making.   History:   HPI: Kristen Mcmahon is a 49 y.o. female who presents today with complaints of pain in her LEFT ear. Pain began with acute onset last night after cleaning her ears using a cotton tipped swab. She denies any associated fevers. Patient has not had any other recent upper respiratory symptoms; no cough, congestion, rhinorrhea, sneezing, or sore throat. She denies forceful nose blowing. Patient has appreciated bleeding from her ear. She advises that her ability to hear from the LEFT ear was muffled last night, however improved after she removed a large blood clot. Patient denies history of recurrent ear infections. She has never had tympanostomy tubes in the past. Patient advising that she has not been swimming in the recent past. Despite her symptoms, patient has not taken any over the counter interventions to help improve/relieve her reported symptoms at home.   Past Medical History:  Diagnosis Date  . Aortic valve disorder   . Asthma   . Dysuria   . FOP (fibrodysplasia ossificans progressiva)   . Gross hematuria   . Hypertension   . Kidney stones   . Migraines   . Reflux   . Rhinitis   . UTI (lower urinary tract infection)     Past Surgical History:  Procedure Laterality Date  . ABDOMINAL HYSTERECTOMY    . Hallux rigidus of left foot  2014    Family History  Problem Relation Age of Onset  . Bladder Cancer Father   . Diabetes Mellitus II Father   . Prostate cancer Father   . Hypertension Father   .  Leukemia Paternal Grandmother   . Kidney disease Neg Hx   . Kidney cancer Neg Hx   . Breast cancer Neg Hx     Social History   Tobacco Use  . Smoking status: Former Smoker    Quit date: 1995    Years since quitting: 26.1  . Smokeless tobacco: Never Used  . Tobacco comment: Quit 25 Years  Substance Use Topics  . Alcohol use: Yes    Alcohol/week: 0.0 standard drinks    Comment: occasional  . Drug use: No    Patient Active Problem List   Diagnosis Date Noted  . Thrombocytosis ([place]) 07/07/2018  . Leukocytosis 07/07/2018  . Autoantibody titer elevated 12/14/2014  . Asthma without status asthmaticus 12/14/2014  . Acid reflux 12/14/2014  . Breath shortness 12/14/2014  . Microscopic hematuria 11/22/2014  . UPJ (ureteropelvic junction) obstruction 11/22/2014  . Vaginal yeast infection 11/22/2014  . Dysuria 11/01/2014  . History of hematuria 11/01/2014  . HLD (hyperlipidemia) 05/23/2014  . Avitaminosis D 05/23/2014  . Headache, migraine 04/27/2014  . Pulmonary hypertension ([place]) 03/14/2011  . Fast heart beat 03/14/2011    Home Medications:    Current Meds  Medication Sig  . aspirin EC 81 MG tablet Take 81 mg by mouth daily.  . baclofen (LIORESAL) 20 MG tablet Take 20 mg by mouth 3 (three) times daily.   . cycloSPORINE (RESTASIS) 0.05 % ophthalmic emulsion Apply 1 drop to eye 2 (two) times daily.   Marland Kitchen  Fluticasone-Salmeterol (ADVAIR DISKUS) 250-50 MCG/DOSE AEPB 1 puff bid  . iron polysaccharides (NIFEREX) 150 MG capsule Take 150 mg by mouth 2 (two) times daily.   Marland Kitchen lamoTRIgine (LAMICTAL) 200 MG tablet Take 400 mg by mouth daily.   Marland Kitchen losartan-hydrochlorothiazide (HYZAAR) 50-12.5 MG tablet Take 1 tablet by mouth daily.   . montelukast (SINGULAIR) 10 MG tablet Take 10 mg by mouth at bedtime.   . Omega-3 1000 MG CAPS Take 1 capsule by mouth daily.   . simvastatin (ZOCOR) 20 MG tablet Take 20 mg by mouth daily.  . Vitamin D, Ergocalciferol, (DRISDOL) 50000 UNITS CAPS capsule Take  50,000 Units by mouth every 7 (seven) days.     Allergies:   Sulfa antibiotics and Tetracycline  Review of Systems (ROS):  Review of systems NEGATIVE unless otherwise noted in narrative H&P section.   Vital Signs: Today's Vitals   04/09/19 0814 04/09/19 0817 04/09/19 0845  BP:  126/81   Pulse:  91   Resp:  14   Temp:  98.6 F (37 C)   TempSrc:  Oral   SpO2:  99%   Weight: 215 lb (97.5 kg)    Height: 5\' 4"  (1.626 m)    PainSc: 2   2     Physical Exam: Physical Exam  Constitutional: She is oriented to person, place, and time and well-developed, well-nourished, and in no distress.  HENT:  Head: Normocephalic and atraumatic.  Right Ear: Hearing and tympanic membrane normal.  Left Ear: Hearing and tympanic membrane normal. There is drainage (bright red blood) and tenderness. Tympanic membrane is not perforated.  LEFT ear with EAC that is actively bleeding. (+) area of tissue maceration noted to lateral wall. (+) tenderness when otoscope tip directed towards area of bleeding.   Eyes: Pupils are equal, round, and reactive to light.  Cardiovascular: Normal rate.  Pulmonary/Chest: Effort normal. No respiratory distress.  Neurological: She is alert and oriented to person, place, and time. Gait normal.  Skin: Skin is warm and dry. No rash noted. She is not diaphoretic.  Psychiatric: Memory, affect and judgment normal. Her mood appears anxious.  Nursing note and vitals reviewed.   Urgent Care Treatments / Results:   No orders of the defined types were placed in this encounter.   LABS: PLEASE NOTE: all labs that were ordered this encounter are listed, however only abnormal results are displayed. Labs Reviewed - No data to display  EKG: -None  RADIOLOGY: No results found.  PROCEDURES: Procedures  MEDICATIONS RECEIVED THIS VISIT: Medications - No data to display  PERTINENT CLINICAL COURSE NOTES/UPDATES:   Initial Impression / Assessment and Plan / Urgent Care Course:   Pertinent labs & imaging results that were available during my care of the patient were personally reviewed by me and considered in my medical decision making (see lab/imaging section of note for values and interpretations).  Assunta E Bangura is a 49 y.o. female who presents to [facility] today with complaints of Otalgia  Patient is well appearing overall in clinic today. She does not appear to be in any acute distress. Presenting symptoms (see HPI) and exam as documented above. Active bleeding in LEFT ear on initial exam. Blood and clots obscuring full inspection of TM. Attempted small volume lavage using warm water, however this was not sufficient for me to see the TM. Manual removal of large (fresh; bright red) clots removed from EAC using a cotton tipped swab under direct visualization using otoscope. TM able to be visualized,  however canal continued to bleed. Patient reporting significant pain. Will proceed as follows:   In efforts to promote vasoconstriction and topical anesthesia, phenylephrine + lidocaine soaked portion of gauze placed into ear (canal packed) and allowed to dwell x 5 minutes.  Packing gauze removed and ear re-examined. Bleeding reduced overall, however persists.    Area of active bleeding appears to be coming from lateral wall of the EAC. There is a fair amount of white exudate noted in the canal, which represents macerated tissue vs. pseudomonas.    TM appears to be intact and patient reports that hearing is at baseline.    She complains of pain with movement of the auricle and tragus. EAC is tender, erythematous, and bleeding.   Will cover for otitis externa using a 5 day course Cortisporin otic gtts.    Patient advised to place gtts per orders and insert small port of gauze for the first couple of instillations in the setting of bleeding and white exudate.    Advised to keep ears clean and dry.    Encouraged to avoid use of cotton tipped swabs for  routine ear cleaning.    May use Tylenol and/or Ibuprofen as needed for pain.    Patient to return call to the clinic, or follow up with her PCP if not improving. Discussed that if ear continues to bleed, she will need to be seen in consult by ENT for further evaluation.   Discussed follow up with primary care physician in 1 week, or sooner as needed, for re-evaluation. I have reviewed the follow up and strict return precautions for any new or worsening symptoms. Patient is aware of symptoms that would be deemed urgent/emergent, and would thus require further evaluation either here or in the emergency department. At the time of discharge, she verbalized understanding and consent with the discharge plan as it was reviewed with her. All questions were fielded by provider and/or clinic staff prior to patient discharge.    Final Clinical Impressions / Urgent Care Diagnoses:   Final diagnoses:  Acute hemorrhagic otitis externa of left ear  Otalgia of left ear    New Prescriptions:  [place] Controlled Substance Registry consulted? Not Applicable  Meds ordered this encounter  Medications  . neomycin-polymyxin-hydrocortisone (CORTISPORIN) 3.5-10000-1 OTIC suspension    Sig: Place 4 drops into the left ear 3 (three) times daily. X 5 days    Dispense:  10 mL    Refill:  0    Recommended Follow up Care:  Patient encouraged to follow up with the following provider within the specified time frame, or sooner as dictated by the severity of her symptoms. As always, she was instructed that for any urgent/emergent care needs, she should seek care either here or in the emergency department for more immediate evaluation.  Follow-up Information    Tracie Harrier, MD In 1 week.   Specialty: Internal Medicine Why: General reassessment of symptoms if not improving Contact information: [place] [phone]         NOTE: This note was prepared  using Dragon dictation software along with smaller phrase technology. Despite my best ability to proofread, there is the potential that transcriptional errors may still occur from this process, and are completely unintentional.    Karen Kitchens, NP 04/09/19 2254

## 2019-04-09 NOTE — Discharge Instructions (Signed)
It was very nice seeing you today in clinic. Thank you for entrusting me with your care.   Keep ear clean and dry. AVOID cotton tipped swabs. May use Tylenol and/or Ibuprofen as needed for pain/fever.   Make arrangements to follow up with your regular doctor in 1 week for re-evaluation if not improving. If your symptoms/condition worsens, please seek follow up care either here or in the ER. Please remember, our [place] providers are "right here with you" when you need [place].   Again, it was my pleasure to take care of you today. Thank you for choosing our clinic. I hope that you start to feel better quickly.   Honor Loh, MSN, APRN, FNP-C, CEN Advanced Practice Provider [facility]

## 2019-09-08 HISTORY — PX: EYE SURGERY: SHX253

## 2020-01-30 ENCOUNTER — Ambulatory Visit
Admission: RE | Admit: 2020-01-30 | Discharge: 2020-01-30 | Disposition: A | Discharge status: Home / Self Care | Payer: 59 | Source: Ambulatory Visit | Attending: Obstetrics and Gynecology | Admitting: Obstetrics and Gynecology

## 2020-01-30 ENCOUNTER — Other Ambulatory Visit: Payer: Self-pay

## 2020-01-30 DIAGNOSIS — R921 Mammographic calcification found on diagnostic imaging of breast: Secondary | ICD-10-CM | POA: Diagnosis present

## 2020-07-24 ENCOUNTER — Inpatient Hospital Stay: Payer: 59 | Attending: Nurse Practitioner | Admitting: Nurse Practitioner

## 2020-07-24 ENCOUNTER — Inpatient Hospital Stay: Payer: 59

## 2020-07-24 ENCOUNTER — Encounter: Payer: Self-pay | Admitting: Nurse Practitioner

## 2020-07-24 ENCOUNTER — Other Ambulatory Visit: Payer: Self-pay

## 2020-07-24 VITALS — BP 124/75 | HR 100 | Temp 98.5°F | Resp 20 | Wt 228.7 lb

## 2020-07-24 DIAGNOSIS — Z8042 Family history of malignant neoplasm of prostate: Secondary | ICD-10-CM | POA: Insufficient documentation

## 2020-07-24 DIAGNOSIS — R760 Raised antibody titer: Secondary | ICD-10-CM | POA: Insufficient documentation

## 2020-07-24 DIAGNOSIS — Z806 Family history of leukemia: Secondary | ICD-10-CM | POA: Diagnosis not present

## 2020-07-24 DIAGNOSIS — Z87891 Personal history of nicotine dependence: Secondary | ICD-10-CM | POA: Diagnosis not present

## 2020-07-24 DIAGNOSIS — D72829 Elevated white blood cell count, unspecified: Secondary | ICD-10-CM

## 2020-07-24 DIAGNOSIS — R490 Dysphonia: Secondary | ICD-10-CM | POA: Diagnosis not present

## 2020-07-24 DIAGNOSIS — Z7982 Long term (current) use of aspirin: Secondary | ICD-10-CM | POA: Diagnosis not present

## 2020-07-24 DIAGNOSIS — E611 Iron deficiency: Secondary | ICD-10-CM | POA: Insufficient documentation

## 2020-07-24 DIAGNOSIS — Z90710 Acquired absence of both cervix and uterus: Secondary | ICD-10-CM | POA: Diagnosis not present

## 2020-07-24 DIAGNOSIS — D75839 Thrombocytosis, unspecified: Secondary | ICD-10-CM | POA: Diagnosis not present

## 2020-07-24 DIAGNOSIS — Z8052 Family history of malignant neoplasm of bladder: Secondary | ICD-10-CM | POA: Insufficient documentation

## 2020-07-24 DIAGNOSIS — J45909 Unspecified asthma, uncomplicated: Secondary | ICD-10-CM | POA: Diagnosis not present

## 2020-07-24 LAB — CBC WITH DIFFERENTIAL/PLATELET
Abs Immature Granulocytes: 0.11 10*3/uL — ABNORMAL HIGH (ref 0.00–0.07)
Basophils Absolute: 0.1 10*3/uL (ref 0.0–0.1)
Basophils Relative: 1 %
Eosinophils Absolute: 0.4 10*3/uL (ref 0.0–0.5)
Eosinophils Relative: 3 %
HCT: 37.6 % (ref 36.0–46.0)
Hemoglobin: 12.9 g/dL (ref 12.0–15.0)
Immature Granulocytes: 1 %
Lymphocytes Relative: 17 %
Lymphs Abs: 2.7 10*3/uL (ref 0.7–4.0)
MCH: 30.5 pg (ref 26.0–34.0)
MCHC: 34.3 g/dL (ref 30.0–36.0)
MCV: 88.9 fL (ref 80.0–100.0)
Monocytes Absolute: 0.6 10*3/uL (ref 0.1–1.0)
Monocytes Relative: 4 %
Neutro Abs: 11.6 10*3/uL — ABNORMAL HIGH (ref 1.7–7.7)
Neutrophils Relative %: 74 %
Platelets: 527 10*3/uL — ABNORMAL HIGH (ref 150–400)
RBC: 4.23 MIL/uL (ref 3.87–5.11)
RDW: 13.3 % (ref 11.5–15.5)
WBC: 15.5 10*3/uL — ABNORMAL HIGH (ref 4.0–10.5)
nRBC: 0 % (ref 0.0–0.2)

## 2020-07-24 LAB — SEDIMENTATION RATE: Sed Rate: 68 mm/hr — ABNORMAL HIGH (ref 0–30)

## 2020-07-24 LAB — COMPREHENSIVE METABOLIC PANEL
ALT: 26 U/L (ref 0–44)
AST: 23 U/L (ref 15–41)
Albumin: 4.3 g/dL (ref 3.5–5.0)
Alkaline Phosphatase: 84 U/L (ref 38–126)
Anion gap: 8 (ref 5–15)
BUN: 16 mg/dL (ref 6–20)
CO2: 24 mmol/L (ref 22–32)
Calcium: 9.6 mg/dL (ref 8.9–10.3)
Chloride: 104 mmol/L (ref 98–111)
Creatinine, Ser: 0.62 mg/dL (ref 0.44–1.00)
GFR, Estimated: >60 mL/min (ref >60)
Glucose, Bld: 102 mg/dL — ABNORMAL HIGH (ref 70–99)
Potassium: 3.4 mmol/L — ABNORMAL LOW (ref 3.5–5.1)
Sodium: 136 mmol/L (ref 135–145)
Total Bilirubin: 0.4 mg/dL (ref 0.3–1.2)
Total Protein: 9 g/dL — ABNORMAL HIGH (ref 6.5–8.1)

## 2020-07-24 LAB — C-REACTIVE PROTEIN: CRP: 1.4 mg/dL — ABNORMAL HIGH (ref <1.0)

## 2020-07-24 LAB — TECHNOLOGIST SMEAR REVIEW: Plt Morphology: NORMAL

## 2020-07-24 LAB — IRON AND TIBC
Iron: 71 ug/dL (ref 28–170)
Saturation Ratios: 21 % (ref 10.4–31.8)
TIBC: 343 ug/dL (ref 250–450)
UIBC: 272 ug/dL

## 2020-07-24 LAB — FERRITIN: Ferritin: 416 ng/mL — ABNORMAL HIGH (ref 11–307)

## 2020-07-24 NOTE — Progress Notes (Signed)
New Consult Note [facility] [address], [place], [place] [address] [phone] (phone) [phone] (fax)  Patient Care Team: Tracie Harrier, MD as PCP - General (Internal Medicine)   Name of the patient: Kristen Mcmahon  [id number]  [birth year]   Date of visit: 07/24/20  Chief complaint/ Reason for visit- Leukocytosis & Thrombocytosis  History of Presenting Illness: Patient is 50 year old female with pmh significant for migraine, lupus anticoagulant, anticardiolipin antibodies, asthma, CIN I, hptn, hld, hepatic steatosis, FOP (question this as patient is not currently followed by anyone for this and is 50 years old without significant debility), who is referred by Dr. Ginette Pitman for evaluation of thrombocytosis and leukocytosis. She has had this for many years and by her report, her counts fluctuate. She was previously seen by Dr. Mike Gip in June 2020 for same. Labs at that time were reassuring and labs thought to be reflective of inflammation.   Her grandmother had leukemia. Mother had a blood clot. She takes oral iron for possible iron deficiency. She has chronic hoarseness due to asthma and inhaler use. She has a history of hysterectomy for benign fibroids. No abnormal bleeding or bruising.   She feels at baseline and denies dizziness or weakness. No recent fevers, chills, infections. No easy bleeding or bruising. Denies unintentional weight loss. No chest pain or shortness of breath. Asthma has been well controlled. She denies nausea, vomiting, constipation, or diarrhea. She denies urinary complaints. No further specific complaints today.   ECOG FS:0 - Asymptomatic  Review of systems- Review of Systems  Constitutional:  Negative for chills, fever, malaise/fatigue and weight loss.  HENT:  Negative for hearing loss, nosebleeds, sore throat and tinnitus.   Eyes:  Negative for blurred vision and double vision.  Respiratory:  Negative for cough, hemoptysis, shortness of breath  and wheezing.   Cardiovascular:  Negative for chest pain, palpitations and leg swelling.  Gastrointestinal:  Negative for abdominal pain, blood in stool, constipation, diarrhea, melena, nausea and vomiting.  Genitourinary:  Negative for dysuria, frequency, hematuria and urgency.  Musculoskeletal:  Negative for back pain, falls, joint pain and myalgias.  Skin:  Negative for itching and rash.  Neurological:  Negative for dizziness, tingling, sensory change, loss of consciousness, weakness and headaches.  Endo/Heme/Allergies:  Negative for environmental allergies. Does not bruise/bleed easily.  Psychiatric/Behavioral:  Negative for depression. The patient is not nervous/anxious and does not have insomnia.      Allergies  Allergen Reactions   Sulfa Antibiotics Rash   Tetracycline Other (See Comments)    Caused migraines    Past Medical History:  Diagnosis Date   Aortic valve disorder    Asthma    Dysuria    FOP (fibrodysplasia ossificans progressiva)    Gross hematuria    Hypertension    Kidney stones    Migraines    Reflux    Rhinitis    UTI (lower urinary tract infection)     Past Surgical History:  Procedure Laterality Date   ABDOMINAL HYSTERECTOMY     Hallux rigidus of left foot  2014    Social History   Socioeconomic History   Marital status: Married    Spouse name: Not on file   Number of children: Not on file   Years of education: Not on file   Highest education level: Not on file  Occupational History   Not on file  Tobacco Use   Smoking status: Former    Pack years: 0.00    Types:  Cigarettes    Quit date: 2    Years since quitting: 27.4   Smokeless tobacco: Never   Tobacco comments:    Quit 25 Years  Vaping Use   Vaping Use: Never used  Substance and Sexual Activity   Alcohol use: Yes    Alcohol/week: 0.0 standard drinks    Comment: occasional   Drug use: No   Sexual activity: Not on file  Other Topics Concern   Not on file  Social History  Narrative   Not on file   Social Determinants of Health   Financial Resource Strain: Not on file  Food Insecurity: Not on file  Transportation Needs: Not on file  Physical Activity: Not on file  Stress: Not on file  Social Connections: Not on file  Intimate Partner Violence: Not on file    Family History  Problem Relation Age of Onset   Bladder Cancer Father    Diabetes Mellitus II Father    Prostate cancer Father    Hypertension Father    Leukemia Paternal Grandmother    Kidney disease Neg Hx    Kidney cancer Neg Hx    Breast cancer Neg Hx      Current Outpatient Medications:    aspirin EC 81 MG tablet, Take 81 mg by mouth daily., Disp: , Rfl:    baclofen (LIORESAL) 20 MG tablet, Take 20 mg by mouth 3 (three) times daily. , Disp: , Rfl:    cycloSPORINE (RESTASIS) 0.05 % ophthalmic emulsion, Apply 1 drop to eye 2 (two) times daily. , Disp: , Rfl:    Fluticasone-Salmeterol (ADVAIR DISKUS) 250-50 MCG/DOSE AEPB, 1 puff bid, Disp: , Rfl:    iron polysaccharides (NIFEREX) 150 MG capsule, Take 150 mg by mouth 2 (two) times daily. , Disp: , Rfl:    lamoTRIgine (LAMICTAL) 200 MG tablet, Take 400 mg by mouth daily. , Disp: , Rfl:    losartan-hydrochlorothiazide (HYZAAR) 50-12.5 MG tablet, Take 1 tablet by mouth daily. , Disp: , Rfl:    montelukast (SINGULAIR) 10 MG tablet, Take 10 mg by mouth at bedtime. , Disp: , Rfl:    neomycin-polymyxin-hydrocortisone (CORTISPORIN) 3.5-10000-1 OTIC suspension, Place 4 drops into the left ear 3 (three) times daily. X 5 days, Disp: 10 mL, Rfl: 0   Omega-3 1000 MG CAPS, Take 1 capsule by mouth daily. , Disp: , Rfl:    promethazine (PHENERGAN) 50 MG tablet, , Disp: , Rfl:    simvastatin (ZOCOR) 20 MG tablet, Take 20 mg by mouth daily., Disp: , Rfl:    SUMAtriptan (IMITREX) 100 MG tablet, Take 100 mg by mouth as needed. , Disp: , Rfl:    traMADol (ULTRAM) 50 MG tablet, Take 50 mg by mouth every 6 (six) hours as needed. , Disp: , Rfl:    Vitamin D,  Ergocalciferol, (DRISDOL) 50000 UNITS CAPS capsule, Take 50,000 Units by mouth every 7 (seven) days. , Disp: , Rfl:   Physical exam:  Vitals:   07/24/20 1054  BP: 124/75  Pulse: 100  Resp: 20  Temp: 98.5 F (36.9 C)  SpO2: (!) 20%  Weight: 228 lb 11.6 oz (103.8 kg)  PF: 95 L/min   Physical Exam Constitutional:      General: She is not in acute distress.    Appearance: She is well-developed. She is not ill-appearing.  HENT:     Head: Atraumatic.     Nose: Nose normal.     Mouth/Throat:     Pharynx: No oropharyngeal exudate.  Comments: hoarseness Eyes:     General: No scleral icterus.    Conjunctiva/sclera: Conjunctivae normal.     Pupils: Pupils are equal, round, and reactive to light.  Cardiovascular:     Rate and Rhythm: Normal rate and regular rhythm.  Pulmonary:     Effort: Pulmonary effort is normal.     Breath sounds: Normal breath sounds.  Abdominal:     General: Bowel sounds are normal. There is no distension.     Palpations: Abdomen is soft.     Tenderness: There is no abdominal tenderness.  Musculoskeletal:        General: No tenderness or deformity. Normal range of motion.     Cervical back: Normal range of motion and neck supple.  Skin:    General: Skin is warm and dry.     Coloration: Skin is not pale.  Neurological:     General: No focal deficit present.     Mental Status: She is alert and oriented to person, place, and time.  Psychiatric:        Mood and Affect: Mood normal.        Behavior: Behavior normal.     CMP Latest Ref Rng & Units 08/09/2013  Glucose 65 - 99 mg/dL 110(H)  BUN 7 - 18 mg/dL 6(L)  Creatinine 0.60 - 1.30 mg/dL 0.67  Sodium 136 - 145 mmol/L 139  Potassium 3.5 - 5.1 mmol/L 3.8  Chloride 98 - 107 mmol/L 106  CO2 21 - 32 mmol/L 26  Calcium 8.5 - 10.1 mg/dL 8.3(L)   CBC Latest Ref Rng & Units 07/07/2018  WBC 4.0 - 10.5 K/uL 16.3(H)  Hemoglobin 12.0 - 15.0 g/dL 13.3  Hematocrit 36.0 - 46.0 % 39.5  Platelets 150 - 400 K/uL  464(H)    No images are attached to the encounter.  No results found.  Assessment and plan- Patient is a 50 y.o. female who presents in consultation from pcp for evaluation of thrombocytosis and leukocytosis.   Leukocytosis & Thrombocytosis- mild. Suspect reactive based on previous work up but will repeat labs today. Chart review reveals that wbcs have ranged between 13.4 and 16.3 with ANC elevated over past 2 years. Platelet count has ranged between 400s and 500s over past 2 years.  Iron Deficiiency - on oral iron. Will recheck ferritin and iron and tibc today Lupus anticoagulant and anticardiolipin antibodies- by her report. Testing in the 1990s. No records available. She has not had a personal history of clot. She continues baby aspirin  Labs today: cbc, cmp, tech smear with reflex to pathologist if needed, JAK2 with reflex, BCR-ABL1 FISH, CRP, Sed rate.   1 month- f/u with MD for discussion of results - la  Visit Diagnosis 1. Leukocytosis, unspecified type   2. Thrombocytosis     Patient expressed understanding and was in agreement with this plan. She also understands that She can call clinic at any time with any questions, concerns, or complaints.   Thank you for allowing me to participate in the care of this very pleasant patient.   Beckey Rutter, DNP, AGNP-C [facility]  CC: Dr. Ginette Pitman

## 2020-07-27 LAB — BCR-ABL1 FISH
Cells Analyzed: 200
Cells Counted: 200

## 2020-08-03 LAB — JAK2  V617F QUAL. WITH REFLEX TO EXON 12: Reflex:: 15

## 2020-08-03 LAB — JAK2 EXONS 12-15

## 2020-08-29 ENCOUNTER — Other Ambulatory Visit: Payer: Self-pay

## 2020-08-29 DIAGNOSIS — D72829 Elevated white blood cell count, unspecified: Secondary | ICD-10-CM

## 2020-08-31 ENCOUNTER — Other Ambulatory Visit: Payer: Self-pay

## 2020-08-31 ENCOUNTER — Encounter: Payer: Self-pay | Admitting: Nurse Practitioner

## 2020-08-31 ENCOUNTER — Inpatient Hospital Stay: Payer: 59 | Attending: Nurse Practitioner

## 2020-08-31 ENCOUNTER — Inpatient Hospital Stay: Payer: 59 | Admitting: Nurse Practitioner

## 2020-08-31 VITALS — BP 132/73 | HR 93 | Temp 99.1°F | Resp 18 | Wt 230.3 lb

## 2020-08-31 DIAGNOSIS — J45909 Unspecified asthma, uncomplicated: Secondary | ICD-10-CM | POA: Diagnosis not present

## 2020-08-31 DIAGNOSIS — Z79899 Other long term (current) drug therapy: Secondary | ICD-10-CM | POA: Diagnosis not present

## 2020-08-31 DIAGNOSIS — I359 Nonrheumatic aortic valve disorder, unspecified: Secondary | ICD-10-CM | POA: Insufficient documentation

## 2020-08-31 DIAGNOSIS — D72829 Elevated white blood cell count, unspecified: Secondary | ICD-10-CM | POA: Diagnosis not present

## 2020-08-31 DIAGNOSIS — Z7951 Long term (current) use of inhaled steroids: Secondary | ICD-10-CM | POA: Diagnosis not present

## 2020-08-31 DIAGNOSIS — D75839 Thrombocytosis, unspecified: Secondary | ICD-10-CM

## 2020-08-31 DIAGNOSIS — K760 Fatty (change of) liver, not elsewhere classified: Secondary | ICD-10-CM | POA: Insufficient documentation

## 2020-08-31 DIAGNOSIS — D6862 Lupus anticoagulant syndrome: Secondary | ICD-10-CM | POA: Insufficient documentation

## 2020-08-31 DIAGNOSIS — E785 Hyperlipidemia, unspecified: Secondary | ICD-10-CM | POA: Insufficient documentation

## 2020-08-31 LAB — CBC WITH DIFFERENTIAL/PLATELET
Abs Immature Granulocytes: 0.12 10*3/uL — ABNORMAL HIGH (ref 0.00–0.07)
Basophils Absolute: 0.1 10*3/uL (ref 0.0–0.1)
Basophils Relative: 1 %
Eosinophils Absolute: 0.3 10*3/uL (ref 0.0–0.5)
Eosinophils Relative: 2 %
HCT: 38.3 % (ref 36.0–46.0)
Hemoglobin: 13 g/dL (ref 12.0–15.0)
Immature Granulocytes: 1 %
Lymphocytes Relative: 18 %
Lymphs Abs: 2.9 10*3/uL (ref 0.7–4.0)
MCH: 30.5 pg (ref 26.0–34.0)
MCHC: 33.9 g/dL (ref 30.0–36.0)
MCV: 89.9 fL (ref 80.0–100.0)
Monocytes Absolute: 0.7 10*3/uL (ref 0.1–1.0)
Monocytes Relative: 5 %
Neutro Abs: 11.9 10*3/uL — ABNORMAL HIGH (ref 1.7–7.7)
Neutrophils Relative %: 73 %
Platelets: 497 10*3/uL — ABNORMAL HIGH (ref 150–400)
RBC: 4.26 MIL/uL (ref 3.87–5.11)
RDW: 13.5 % (ref 11.5–15.5)
WBC: 15.9 10*3/uL — ABNORMAL HIGH (ref 4.0–10.5)
nRBC: 0 % (ref 0.0–0.2)

## 2020-08-31 LAB — COMPREHENSIVE METABOLIC PANEL
ALT: 24 U/L (ref 0–44)
AST: 26 U/L (ref 15–41)
Albumin: 4.2 g/dL (ref 3.5–5.0)
Alkaline Phosphatase: 89 U/L (ref 38–126)
Anion gap: 11 (ref 5–15)
BUN: 10 mg/dL (ref 6–20)
CO2: 22 mmol/L (ref 22–32)
Calcium: 9.5 mg/dL (ref 8.9–10.3)
Chloride: 102 mmol/L (ref 98–111)
Creatinine, Ser: 0.65 mg/dL (ref 0.44–1.00)
GFR, Estimated: >60 mL/min (ref >60)
Glucose, Bld: 102 mg/dL — ABNORMAL HIGH (ref 70–99)
Potassium: 4 mmol/L (ref 3.5–5.1)
Sodium: 135 mmol/L (ref 135–145)
Total Bilirubin: 0.6 mg/dL (ref 0.3–1.2)
Total Protein: 8.7 g/dL — ABNORMAL HIGH (ref 6.5–8.1)

## 2020-08-31 NOTE — Progress Notes (Signed)
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 Ginette Pitman for evaluation of thrombocytosis and leukocytosis. She has had this for many years and by her report, her counts fluctuate. She was previously seen by Dr. Mike Gip in June 2020 for same. Labs at that time were reassuring and labs thought to be reflective of inflammation.   Her grandmother had leukemia. Mother had a blood clot. She takes oral iron for possible iron deficiency. She has chronic hoarseness due to asthma and inhaler use. She has a history of hysterectomy for benign fibroids. No abnormal bleeding or bruising.   Interval History: Patient is 50 year old female who returns to clinic for discussion of lab results.  She continues to feel well and denies specific complaints.  Has chronic hoarseness related to her asthma medications.  Otherwise feels at baseline and denies specific complaints.  No fevers or chills.  No interval infections.  ECOG FS:0 - Asymptomatic  Review of systems- Review of Systems  Constitutional:  Negative for chills, fever, malaise/fatigue and weight loss.  HENT:  Negative for hearing loss, nosebleeds, sore throat and tinnitus.   Eyes:  Negative for blurred vision and double vision.  Respiratory:  Negative for cough, hemoptysis, shortness of breath and wheezing.    Cardiovascular:  Negative for chest pain, palpitations and leg swelling.  Gastrointestinal:  Negative for abdominal pain, blood in stool, constipation, diarrhea, melena, nausea and vomiting.  Genitourinary:  Negative for dysuria, frequency, hematuria and urgency.  Musculoskeletal:  Negative for back pain, falls, joint pain and myalgias.  Skin:  Negative for itching and rash.  Neurological:  Negative for dizziness, tingling, sensory change, loss of consciousness, weakness and headaches.  Endo/Heme/Allergies:  Negative for environmental allergies. Does not bruise/bleed easily.  Psychiatric/Behavioral:  Negative for depression. The patient is not nervous/anxious and does not have insomnia.      Allergies  Allergen Reactions   Sulfa Antibiotics Rash   Tetracycline Other (See Comments)    Caused migraines    Past Medical History:  Diagnosis Date   Aortic valve disorder    Asthma    Dysuria    FOP (fibrodysplasia ossificans progressiva)    Gross hematuria    Hypertension    Kidney stones    Migraines    Reflux    Rhinitis    UTI (lower urinary tract infection)     Past Surgical History:  Procedure Laterality Date   ABDOMINAL HYSTERECTOMY     Hallux rigidus of left foot  2014    Social History   Socioeconomic History   Marital status: Married    Spouse name: Not on file   Number of children: Not on file   Years of education: Not on file   Highest education level: Not on file  Occupational History   Not on file  Tobacco Use   Smoking status: Former    Types: Cigarettes    Quit date:  1995    Years since quitting: 27.5   Smokeless tobacco: Never   Tobacco comments:    Quit 25 Years  Vaping Use   Vaping Use: Never used  Substance and Sexual Activity   Alcohol use: Yes    Alcohol/week: 0.0 standard drinks    Comment: occasional   Drug use: No   Sexual activity: Not on file  Other Topics Concern   Not on file  Social History Narrative   Not on file   Social  Determinants of Health   Financial Resource Strain: Not on file  Food Insecurity: Not on file  Transportation Needs: Not on file  Physical Activity: Not on file  Stress: Not on file  Social Connections: Not on file  Intimate Partner Violence: Not on file    Family History  Problem Relation Age of Onset   Bladder Cancer Father    Diabetes Mellitus II Father    Prostate cancer Father    Hypertension Father    Leukemia Paternal Grandmother    Kidney disease Neg Hx    Kidney cancer Neg Hx    Breast cancer Neg Hx      Current Outpatient Medications:    ALPRAZolam (XANAX) 0.5 MG tablet, Take 1 tablet 1hr prior to procedure. Bring 2nd tablet to the office in case needed. (Patient not taking: Reported on 07/24/2020), Disp: , Rfl:    aspirin EC 81 MG tablet, Take 81 mg by mouth daily., Disp: , Rfl:    baclofen (LIORESAL) 20 MG tablet, Take 20 mg by mouth 3 (three) times daily. , Disp: , Rfl:    cycloSPORINE (RESTASIS) 0.05 % ophthalmic emulsion, Apply 1 drop to eye 2 (two) times daily. , Disp: , Rfl:    Fluticasone-Salmeterol (ADVAIR) 250-50 MCG/DOSE AEPB, 1 puff bid, Disp: , Rfl:    Ipratropium-Albuterol (COMBIVENT RESPIMAT) 20-100 MCG/ACT AERS respimat, Inhale into the lungs., Disp: , Rfl:    iron polysaccharides (NIFEREX) 150 MG capsule, Take 150 mg by mouth 2 (two) times daily. , Disp: , Rfl:    lamoTRIgine (LAMICTAL) 200 MG tablet, Take 400 mg by mouth daily. , Disp: , Rfl:    losartan-hydrochlorothiazide (HYZAAR) 50-12.5 MG tablet, Take 1 tablet by mouth daily. , Disp: , Rfl:    montelukast (SINGULAIR) 10 MG tablet, Take 10 mg by mouth at bedtime. , Disp: , Rfl:    montelukast (SINGULAIR) 10 MG tablet, Take by mouth., Disp: , Rfl:    Omega-3 1000 MG CAPS, Take 1 capsule by mouth daily. , Disp: , Rfl:    promethazine (PHENERGAN) 50 MG tablet, , Disp: , Rfl:    promethazine (PHENERGAN) 50 MG tablet, Take by mouth. (Patient not taking: Reported on 07/24/2020), Disp: , Rfl:     simvastatin (ZOCOR) 20 MG tablet, Take 20 mg by mouth daily., Disp: , Rfl:    simvastatin (ZOCOR) 20 MG tablet, Take by mouth., Disp: , Rfl:    SUMAtriptan (IMITREX) 100 MG tablet, Take 100 mg by mouth as needed. , Disp: , Rfl:    traMADol (ULTRAM) 50 MG tablet, Take 50 mg by mouth every 6 (six) hours as needed.  (Patient not taking: Reported on 07/24/2020), Disp: , Rfl:    traMADol (ULTRAM) 50 MG tablet, Take by mouth. (Patient not taking: Reported on 07/24/2020), Disp: , Rfl:    Vitamin D, Ergocalciferol, (DRISDOL) 50000 UNITS CAPS capsule, Take 50,000 Units by mouth every 7 (seven) days. , Disp: , Rfl:   Physical exam:  Vitals:  08/31/20 0943  BP: 132/73  Pulse: 93  Resp: 18  Temp: 99.1 F (37.3 C)  SpO2: 97%  Weight: 230 lb 4.3 oz (104.5 kg)   Physical Exam Constitutional:      General: She is not in acute distress.    Appearance: She is well-developed. She is not ill-appearing.  HENT:     Head: Atraumatic.     Nose: Nose normal.     Mouth/Throat:     Pharynx: No oropharyngeal exudate.     Comments: Hoarseness, improved Eyes:     General: No scleral icterus.    Conjunctiva/sclera: Conjunctivae normal.  Cardiovascular:     Rate and Rhythm: Normal rate and regular rhythm.  Pulmonary:     Effort: Pulmonary effort is normal.     Breath sounds: Normal breath sounds.  Abdominal:     General: There is no distension.     Palpations: Abdomen is soft.     Tenderness: There is no abdominal tenderness.  Musculoskeletal:        General: No tenderness or deformity. Normal range of motion.     Cervical back: Normal range of motion and neck supple.  Skin:    General: Skin is warm and dry.     Coloration: Skin is not pale.  Neurological:     General: No focal deficit present.     Mental Status: She is alert and oriented to person, place, and time.  Psychiatric:        Mood and Affect: Mood normal.        Behavior: Behavior normal.     CMP Latest Ref Rng & Units 07/24/2020   Glucose 70 - 99 mg/dL 102(H)  BUN 6 - 20 mg/dL 16  Creatinine 0.44 - 1.00 mg/dL 0.62  Sodium 135 - 145 mmol/L 136  Potassium 3.5 - 5.1 mmol/L 3.4(L)  Chloride 98 - 111 mmol/L 104  CO2 22 - 32 mmol/L 24  Calcium 8.9 - 10.3 mg/dL 9.6  Total Protein 6.5 - 8.1 g/dL 9.0(H)  Total Bilirubin 0.3 - 1.2 mg/dL 0.4  Alkaline Phos 38 - 126 U/L 84  AST 15 - 41 U/L 23  ALT 0 - 44 U/L 26   CBC Latest Ref Rng & Units 07/24/2020  WBC 4.0 - 10.5 K/uL 15.5(H)  Hemoglobin 12.0 - 15.0 g/dL 12.9  Hematocrit 36.0 - 46.0 % 37.6  Platelets 150 - 400 K/uL 527(H)    No images are attached to the encounter.  No results found.  Assessment and plan- Patient is a 50 y.o. female who returns to clinic for discussion of lab results.   Leukocytosis - wbcs range between 13.4-16.3 over past 2 years. Labs reviewed and reassuring. Markers of inflammation are elevated. Suspect reactive and related to her comorbidities. If worsening or symptomatic, would consider workup to evaluate for possible myeloproliferative disorder.  Thrombocytosis- elevated but stable for past 2 years. Platelet count ranges in 400s-500s  Today, labs reviewed: jak2 negative. Sed rate and CRP elevated consistent with reactive etiology- suspect asthma.  Iron Deficiency- ferritin elevated, likely reactive. Iron saturation normal. Continue oral iron per pcp.  Lupus anticoagulant and anticardiolipin antibodies- by her report. Testing in the 1990s. No records available. She has not had a personal history of clot. She continues baby aspirin. Could consider repeat testing.   RTC in 3 months for labs & MD to establish care  Visit Diagnosis 1. Leukocytosis, unspecified type   2. Thrombocytosis    Patient expressed understanding and was in  agreement with this plan. She also understands that She can call clinic at any time with any questions, concerns, or complaints.   Thank you for allowing me to participate in the care of this very pleasant patient.    Beckey Rutter, DNP, AGNP-[facility]  CC: Dr. Ginette Pitman

## 2020-11-23 ENCOUNTER — Ambulatory Visit
Admission: EM | Admit: 2020-11-23 | Discharge: 2020-11-23 | Disposition: A | Discharge status: Home / Self Care | Payer: 59

## 2020-11-23 ENCOUNTER — Ambulatory Visit (INDEPENDENT_AMBULATORY_CARE_PROVIDER_SITE_OTHER): Payer: 59

## 2020-11-23 ENCOUNTER — Other Ambulatory Visit: Payer: Self-pay

## 2020-11-23 DIAGNOSIS — S92512A Displaced fracture of proximal phalanx of left lesser toe(s), initial encounter for closed fracture: Secondary | ICD-10-CM | POA: Diagnosis not present

## 2020-11-23 DIAGNOSIS — M79675 Pain in left toe(s): Secondary | ICD-10-CM | POA: Diagnosis not present

## 2020-11-23 MED ORDER — HYDROCODONE-ACETAMINOPHEN 5-325 MG PO TABS: 2.0000 | ORAL_TABLET | ORAL | 0 refills | Status: DC | PRN

## 2020-11-23 NOTE — ED Provider Notes (Signed)
[facility]    CSN: [id number] Arrival date & time: 11/23/20  1915      History   Chief Complaint Chief Complaint  Patient presents with   Toe Pain    Left 5th    HPI Kristen Mcmahon is a 50 y.o. female.   HPI  Toe Pain: Patient reports with her husband.  Prior to arriving to our office they were packing when she stepped her left pinky toe against furniture and felt immediate pain.  They noticed bruising.  Her husband thought that she may have dislocated the toe so he tried to reset it.  They are unsure if this helped.  She has full sensation in the toe does not have any decrease in color.  Pain is severe 10 out of 10 in nature.   Past Medical History:  Diagnosis Date   Aortic valve disorder    Asthma    Dysuria    FOP (fibrodysplasia ossificans progressiva)    Gross hematuria    Hypertension    Kidney stones    Migraines    Reflux    Rhinitis    UTI (lower urinary tract infection)     Patient Active Problem List   Diagnosis Date Noted   Thrombocytosis 07/07/2018   Leukocytosis 07/07/2018   Autoantibody titer elevated 12/14/2014   Asthma without status asthmaticus 12/14/2014   Acid reflux 12/14/2014   Breath shortness 12/14/2014   Microscopic hematuria 11/22/2014   UPJ (ureteropelvic junction) obstruction 11/22/2014   Vaginal yeast infection 11/22/2014   Dysuria 11/01/2014   History of hematuria 11/01/2014   HLD (hyperlipidemia) 05/23/2014   Avitaminosis D 05/23/2014   Headache, migraine 04/27/2014   Pulmonary hypertension ([place]) 03/14/2011   Fast heart beat 03/14/2011    Past Surgical History:  Procedure Laterality Date   ABDOMINAL HYSTERECTOMY     Hallux rigidus of left foot  2014    OB History   No obstetric history on file.      Home Medications    Prior to Admission medications   Medication Sig Start Date End Date Taking? Authorizing Provider  aspirin EC 81 MG tablet Take 81 mg by mouth daily.   Yes [provider]   baclofen (LIORESAL) 20 MG tablet Take 20 mg by mouth 3 (three) times daily.  06/18/18  Yes [provider]  COMBIVENT RESPIMAT 20-100 MCG/ACT AERS respimat SMARTSIG:2 Inhalation Via Inhaler 4 Times Daily 11/05/20  Yes [provider]  cycloSPORINE (RESTASIS) 0.05 % ophthalmic emulsion Apply 1 drop to eye 2 (two) times daily.    Yes [provider]  Fluticasone-Salmeterol (ADVAIR) 250-50 MCG/DOSE AEPB 1 puff bid 04/27/14  Yes [provider]  iron polysaccharides (NIFEREX) 150 MG capsule Take 150 mg by mouth 2 (two) times daily.  11/05/15 11/23/20 Yes [provider]  lamoTRIgine (LAMICTAL) 200 MG tablet Take 400 mg by mouth daily.  04/27/14  Yes [provider]  losartan-hydrochlorothiazide (HYZAAR) 50-12.5 MG tablet Take 1 tablet by mouth daily.  10/30/14  Yes [provider]  montelukast (SINGULAIR) 10 MG tablet Take 10 mg by mouth at bedtime.  04/27/14  Yes [provider]  Omega-3 1000 MG CAPS Take 1 capsule by mouth daily.    Yes [provider]  simvastatin (ZOCOR) 20 MG tablet Take 20 mg by mouth daily.   Yes [provider]  SUMAtriptan (IMITREX) 100 MG tablet Take 100 mg by mouth as needed.  04/27/14  Yes [provider]  traMADol (  ULTRAM) 50 MG tablet Take 50 mg by mouth every 6 (six) hours as needed. 03/15/14  Yes [provider]  Vitamin D, Ergocalciferol, (DRISDOL) 50000 UNITS CAPS capsule Take 50,000 Units by mouth every 7 (seven) days.  04/27/14  Yes [provider]  montelukast (SINGULAIR) 10 MG tablet Take by mouth. 07/04/20 12/31/20  [provider]  promethazine (PHENERGAN) 50 MG tablet  10/30/14   [provider]  promethazine (PHENERGAN) 50 MG tablet Take by mouth. Patient not taking: No sig reported 07/04/20 08/03/20  [provider]    Family History Family History  Problem Relation Age of Onset   Bladder Cancer Father    Diabetes Mellitus II  Father    Prostate cancer Father    Hypertension Father    Leukemia Paternal Grandmother    Kidney disease Neg Hx    Kidney cancer Neg Hx    Breast cancer Neg Hx     Social History Social History   Tobacco Use   Smoking status: Former    Types: Cigarettes    Quit date: 1995    Years since quitting: 27.8   Smokeless tobacco: Never   Tobacco comments:    Quit 25 Years  Vaping Use   Vaping Use: Never used  Substance Use Topics   Alcohol use: Yes    Alcohol/week: 0.0 standard drinks    Comment: occasional   Drug use: No     Allergies   Sulfa antibiotics and Tetracycline   Review of Systems Review of Systems  As stated above in HPI Physical Exam Triage Vital Signs ED Triage Vitals  Enc Vitals Group     BP 11/23/20 1939 134/86     Pulse Rate 11/23/20 1939 98     Resp 11/23/20 1939 18     Temp 11/23/20 1939 98.1 F (36.7 C)     Temp Source 11/23/20 1939 Oral     SpO2 11/23/20 1939 100 %     Weight 11/23/20 1936 225 lb (102.1 kg)     Height 11/23/20 1936 5\' 4"  (1.626 m)     Head Circumference --      Peak Flow --      Pain Score 11/23/20 1936 8     Pain Loc --      Pain Edu? --      Excl. in [place]? --    No data found.  Updated Vital Signs BP 134/86 (BP Location: Left Arm)   Pulse 98   Temp 98.1 F (36.7 C) (Oral)   Resp 18   Ht 5\' 4"  (1.626 m)   Wt 225 lb (102.1 kg)   SpO2 100%   BMI 38.62 kg/m   Physical Exam Vitals and nursing note reviewed.  Constitutional:      Appearance: Normal appearance.  Cardiovascular:     Pulses: Normal pulses.  Musculoskeletal:        General: Swelling, tenderness, deformity and signs of injury present.     Comments: Decreased ROM of the left 5th toe with tenderness. Negative fracture testing of the left foot  Skin:    General: Skin is warm.     Capillary Refill: Capillary refill takes less than 2 seconds.     Findings: Bruising present.     Comments: No skin breakdown of pallor  Neurological:     Mental Status:  She is alert and oriented to person, place, and time.     Sensory: No sensory deficit.     UC Treatments /  Results  Labs (all labs ordered are listed, but only abnormal results are displayed) Labs Reviewed - No data to display  EKG   Radiology No results found.  Procedures Procedures (including critical care time)  Medications Ordered in UC Medications - No data to display  Initial Impression / Assessment and Plan / UC Course  I have reviewed the triage vital signs and the nursing notes.  Pertinent labs & imaging results that were available during my care of the patient were reviewed by me and considered in my medical decision making (see chart for details).     New.  Fracture of the toe is seen by myself and the x-ray and confirmed by radiologist read.  Treating with postop shoe and crutches for nonweightbearing status along with orthopedic follow-up.  RICE encouraged along with pain management.  She asked for Norco which is appropriate at this time.  I reviewed her PMP D.  We reviewed how to take this medication along with common potential side effects and precautions along with blackbox warnings. Follow up PRN.  Final Clinical Impressions(s) / UC Diagnoses   Final diagnoses:  None   Discharge Instructions   None    ED Prescriptions   None    PDMP not reviewed this encounter.   Hughie Closs, Hershal Coria 11/23/20 2009

## 2020-11-23 NOTE — ED Triage Notes (Signed)
Pt c/o pain to her left 5th toe after hitting it on some furniture earlier today. Pt has some bruising around the toe. Pt cannot bear weight on the foot.

## 2020-12-07 ENCOUNTER — Encounter: Payer: Self-pay | Admitting: *Deleted

## 2020-12-10 ENCOUNTER — Ambulatory Visit: Payer: 59 | Admitting: Anesthesiology

## 2020-12-10 ENCOUNTER — Ambulatory Visit
Admission: RE | Admit: 2020-12-10 | Discharge: 2020-12-10 | Disposition: A | Discharge status: Home / Self Care | Payer: 59 | Attending: Gastroenterology | Admitting: Gastroenterology

## 2020-12-10 ENCOUNTER — Encounter: Payer: Self-pay | Admitting: *Deleted

## 2020-12-10 ENCOUNTER — Encounter
Admission: RE | Disposition: A | Discharge status: Home / Self Care | Payer: Self-pay | Source: Home / Self Care | Attending: Gastroenterology

## 2020-12-10 DIAGNOSIS — K219 Gastro-esophageal reflux disease without esophagitis: Secondary | ICD-10-CM | POA: Insufficient documentation

## 2020-12-10 DIAGNOSIS — Z1211 Encounter for screening for malignant neoplasm of colon: Secondary | ICD-10-CM | POA: Insufficient documentation

## 2020-12-10 DIAGNOSIS — K640 First degree hemorrhoids: Secondary | ICD-10-CM | POA: Diagnosis not present

## 2020-12-10 DIAGNOSIS — Z8371 Family history of colonic polyps: Secondary | ICD-10-CM | POA: Insufficient documentation

## 2020-12-10 DIAGNOSIS — E669 Obesity, unspecified: Secondary | ICD-10-CM | POA: Diagnosis not present

## 2020-12-10 DIAGNOSIS — J449 Chronic obstructive pulmonary disease, unspecified: Secondary | ICD-10-CM | POA: Insufficient documentation

## 2020-12-10 DIAGNOSIS — Z87891 Personal history of nicotine dependence: Secondary | ICD-10-CM | POA: Insufficient documentation

## 2020-12-10 DIAGNOSIS — Z6839 Body mass index (BMI) 39.0-39.9, adult: Secondary | ICD-10-CM | POA: Insufficient documentation

## 2020-12-10 DIAGNOSIS — I10 Essential (primary) hypertension: Secondary | ICD-10-CM | POA: Insufficient documentation

## 2020-12-10 HISTORY — DX: Chronic obstructive pulmonary disease, unspecified: J44.9

## 2020-12-10 HISTORY — DX: Gastro-esophageal reflux disease without esophagitis: K21.9

## 2020-12-10 HISTORY — PX: COLONOSCOPY WITH PROPOFOL: SHX5780

## 2020-12-10 HISTORY — DX: Anxiety disorder, unspecified: F41.9

## 2020-12-10 HISTORY — DX: Anemia, unspecified: D64.9

## 2020-12-10 HISTORY — DX: Hyperlipidemia, unspecified: E78.5

## 2020-12-10 SURGERY — COLONOSCOPY WITH PROPOFOL
Anesthesia: General

## 2020-12-10 MED ORDER — LIDOCAINE HCL (CARDIAC) PF 100 MG/5ML IV SOSY
PREFILLED_SYRINGE | INTRAVENOUS | Status: DC | PRN
  Administered 2020-12-10: 60 mg via INTRAVENOUS

## 2020-12-10 MED ORDER — SODIUM CHLORIDE 0.9 % IV SOLN: INTRAVENOUS | Status: DC

## 2020-12-10 MED ORDER — LIDOCAINE HCL (CARDIAC) PF 100 MG/5ML IV SOSY: PREFILLED_SYRINGE | INTRAVENOUS | Status: DC | PRN

## 2020-12-10 MED ORDER — PROPOFOL 500 MG/50ML IV EMUL
INTRAVENOUS | Status: AC
  Filled 2020-12-10: qty 150

## 2020-12-10 MED ORDER — PROPOFOL 10 MG/ML IV BOLUS
INTRAVENOUS | Status: DC | PRN
  Administered 2020-12-10: 40 mg via INTRAVENOUS
  Administered 2020-12-10: 80 mg via INTRAVENOUS
  Administered 2020-12-10: 10 mg via INTRAVENOUS
  Administered 2020-12-10: 30 mg via INTRAVENOUS
  Administered 2020-12-10: 40 mg via INTRAVENOUS

## 2020-12-10 MED ORDER — EPHEDRINE 5 MG/ML INJ
INTRAVENOUS | Status: AC
  Filled 2020-12-10: qty 5

## 2020-12-10 MED ORDER — LIDOCAINE HCL (PF) 2 % IJ SOLN
INTRAMUSCULAR | Status: AC
  Filled 2020-12-10: qty 20

## 2020-12-10 MED ORDER — PROPOFOL 500 MG/50ML IV EMUL
INTRAVENOUS | Status: AC
  Filled 2020-12-10: qty 50

## 2020-12-10 MED ORDER — PHENYLEPHRINE HCL-NACL 20-0.9 MG/250ML-% IV SOLN
INTRAVENOUS | Status: AC
  Filled 2020-12-10: qty 250

## 2020-12-10 MED ORDER — PROPOFOL 500 MG/50ML IV EMUL
INTRAVENOUS | Status: DC | PRN
  Administered 2020-12-10: 140 ug/kg/min via INTRAVENOUS

## 2020-12-10 NOTE — Anesthesia Postprocedure Evaluation (Signed)
Anesthesia Post Note  Patient: Kristen Mcmahon  Procedure(s) Performed: COLONOSCOPY WITH PROPOFOL  Patient location during evaluation: PACU Anesthesia Type: General Level of consciousness: awake and awake and alert Pain management: pain level controlled Vital Signs Assessment: post-procedure vital signs reviewed and stable Respiratory status: spontaneous breathing and respiratory function stable Cardiovascular status: blood pressure returned to baseline Anesthetic complications: no   No notable events documented.   Last Vitals:  Vitals:   12/10/20 1021 12/10/20 1031  BP: (!) 141/81 120/77  Pulse: 85 88  Resp: 14 18  Temp:    SpO2: 100% 100%    Last Pain:  Vitals:   12/10/20 1031  TempSrc:   PainSc: 0-No pain                 VAN STAVEREN,Avraj Lindroth

## 2020-12-10 NOTE — H&P (Signed)
[facility] Pre-Procedure H&P   Patient ID: Kristen Mcmahon is a 50 y.o. female.  Gastroenterology Provider: Annamaria Helling, DO  Referring Provider: Dr. Ginette Pitman PCP: Tracie Harrier, MD  Date: 12/10/2020  HPI [name] is a 50 y.o. female who presents today for Colonoscopy for initial screening colonoscopy.  Hgb 12.8. Cr 0.7.   Patient denies nausea, vomiting, coffee ground emesis, hematemesis, abdominal pain, diarrhea, constipation, melena, hematochezia, fever, chills, dysphagia, odynophagia, jaundice, heartburn/reflux.  Father with h/o colon polyps  No fhx of crc  Past Medical History:  Diagnosis Date   Anemia    Anxiety    Aortic valve disorder    Asthma    COPD (chronic obstructive pulmonary disease) (HCC)    Dysuria    FOP (fibrodysplasia ossificans progressiva)    GERD (gastroesophageal reflux disease)    Gross hematuria    HLD (hyperlipidemia)    Hypertension    Kidney stones    Migraines    Reflux    Rhinitis    UTI (lower urinary tract infection)     Past Surgical History:  Procedure Laterality Date   ABDOMINAL HYSTERECTOMY     EYE SURGERY Bilateral 09/08/2019   cataract extraction   Hallux rigidus of left foot  02/04/2012   TUBAL LIGATION      Family History Father with h/o colon polyps No other h/o GI disease or malignancy  Review of Systems  Constitutional:  Negative for activity change, appetite change, fatigue, fever and unexpected weight change.  HENT:  Negative for trouble swallowing and voice change.   Respiratory:  Negative for shortness of breath and wheezing.   Cardiovascular:  Negative for chest pain and palpitations.  Gastrointestinal:  Negative for abdominal distention, abdominal pain, anal bleeding, blood in stool, constipation, diarrhea, nausea, rectal pain and vomiting.  Musculoskeletal:  Negative for arthralgias and myalgias.  Skin:  Negative for color change and pallor.  Neurological:  Negative for  dizziness, syncope and weakness.  Psychiatric/Behavioral:  Negative for confusion.   All other systems reviewed and are negative.   Medications No current facility-administered medications on file prior to encounter.   Current Outpatient Medications on File Prior to Encounter  Medication Sig Dispense Refill   aspirin EC 81 MG tablet Take 81 mg by mouth daily.     baclofen (LIORESAL) 20 MG tablet Take 20 mg by mouth 3 (three) times daily.      Fluticasone-Salmeterol (ADVAIR) 250-50 MCG/DOSE AEPB 1 puff bid     losartan-hydrochlorothiazide (HYZAAR) 50-12.5 MG tablet Take 1 tablet by mouth daily.      montelukast (SINGULAIR) 10 MG tablet Take 10 mg by mouth at bedtime.      montelukast (SINGULAIR) 10 MG tablet Take by mouth.     simvastatin (ZOCOR) 20 MG tablet Take 20 mg by mouth daily.     cycloSPORINE (RESTASIS) 0.05 % ophthalmic emulsion Apply 1 drop to eye 2 (two) times daily.      iron polysaccharides (NIFEREX) 150 MG capsule Take 150 mg by mouth 2 (two) times daily.      lamoTRIgine (LAMICTAL) 200 MG tablet Take 400 mg by mouth daily.      Omega-3 1000 MG CAPS Take 1 capsule by mouth daily.      promethazine (PHENERGAN) 50 MG tablet      promethazine (PHENERGAN) 50 MG tablet Take by mouth. (Patient not taking: No sig reported)     SUMAtriptan (IMITREX) 100 MG tablet Take 100 mg by mouth as  needed.      Vitamin D, Ergocalciferol, (DRISDOL) 50000 UNITS CAPS capsule Take 50,000 Units by mouth every 7 (seven) days.       Pertinent medications related to GI and procedure were reviewed by me with the patient prior to the procedure   Current Facility-Administered Medications:    0.9 %  sodium chloride infusion, , Intravenous, Continuous, Annamaria Helling, DO, Last Rate: 20 mL/hr at 12/10/20 0820, New Bag at 12/10/20 0820      Allergies  Allergen Reactions   Sulfa Antibiotics Rash   Tetracycline Other (See Comments)    Caused migraines   Allergies were reviewed by me prior to  the procedure  Objective    Vitals:   12/10/20 0818  BP: 136/80  Pulse: (!) 106  Resp: 20  Temp: (!) 97 F (36.1 C)  TempSrc: Temporal  SpO2: 98%  Weight: 102.1 kg  Height: 5\' 4"  (1.626 m)     Physical Exam Vitals reviewed.  Constitutional:      General: She is not in acute distress.    Appearance: Normal appearance. She is obese. She is not ill-appearing, toxic-appearing or diaphoretic.  HENT:     Head: Normocephalic and atraumatic.     Nose: Nose normal.     Mouth/Throat:     Mouth: Mucous membranes are moist.     Pharynx: Oropharynx is clear.  Eyes:     General: No scleral icterus.    Extraocular Movements: Extraocular movements intact.  Cardiovascular:     Rate and Rhythm: Regular rhythm. Tachycardia present.     Heart sounds: Normal heart sounds. No murmur heard.   No friction rub. No gallop.  Pulmonary:     Effort: Pulmonary effort is normal. No respiratory distress.     Breath sounds: Normal breath sounds. No wheezing, rhonchi or rales.  Abdominal:     General: Bowel sounds are normal. There is no distension.     Palpations: Abdomen is soft.     Tenderness: There is no abdominal tenderness. There is no guarding or rebound.  Musculoskeletal:     Cervical back: Neck supple.     Right lower leg: No edema.     Left lower leg: No edema.  Skin:    General: Skin is warm and dry.     Coloration: Skin is not jaundiced or pale.  Neurological:     General: No focal deficit present.     Mental Status: She is alert and oriented to person, place, and time. Mental status is at baseline.  Psychiatric:        Mood and Affect: Mood normal.        Behavior: Behavior normal.        Thought Content: Thought content normal.        Judgment: Judgment normal.     Assessment:  [name] is a 50 y.o. female  who presents today for Colonoscopy for initial screening colonoscopy; fhx colon polyps.  Plan:  Colonoscopy with possible intervention  today  Colonoscopy with possible biopsy, control of bleeding, polypectomy, and interventions as necessary has been discussed with the patient/patient representative. Informed consent was obtained from the patient/patient representative after explaining the indication, nature, and risks of the procedure including but not limited to death, bleeding, perforation, missed neoplasm/lesions, cardiorespiratory compromise, and reaction to medications. Opportunity for questions was given and appropriate answers were provided. Patient/patient representative has verbalized understanding is amenable to undergoing the procedure.   Annamaria Helling, DO  [facility]  Portions of the record may have been created with voice recognition software. Occasional wrong-word or 'sound-a-like' substitutions may have occurred due to the inherent limitations of voice recognition software.  Read the chart carefully and recognize, using context, where substitutions may have occurred.

## 2020-12-10 NOTE — Anesthesia Preprocedure Evaluation (Addendum)
Anesthesia Evaluation  Patient identified by MRN, date of birth, ID band Patient awake    Reviewed: Allergy & Precautions, NPO status , Patient's Chart, lab work & pertinent test results  Airway Mallampati: II  TM Distance: >3 FB Neck ROM: full    Dental  (+) Teeth Intact   Pulmonary neg pulmonary ROS, COPD, former smoker,    Pulmonary exam normal  + decreased breath sounds      Cardiovascular Exercise Tolerance: Good hypertension, Pt. on medications negative cardio ROS Normal cardiovascular exam Rhythm:Regular Rate:Normal     Neuro/Psych  Headaches, Anxiety negative neurological ROS  negative psych ROS   GI/Hepatic negative GI ROS, Neg liver ROS, GERD  ,  Endo/Other  negative endocrine ROS  Renal/GU negative Renal ROS  negative genitourinary   Musculoskeletal   Abdominal (+) + obese,   Peds negative pediatric ROS (+)  Hematology negative hematology ROS (+) Blood dyscrasia, anemia ,   Anesthesia Other Findings Past Medical History: No date: Anemia No date: Anxiety No date: Aortic valve disorder No date: Asthma No date: COPD (chronic obstructive pulmonary disease) (HCC) No date: Dysuria No date: FOP (fibrodysplasia ossificans progressiva) No date: GERD (gastroesophageal reflux disease) No date: Gross hematuria No date: HLD (hyperlipidemia) No date: Hypertension No date: Kidney stones No date: Migraines No date: Reflux No date: Rhinitis No date: UTI (lower urinary tract infection)  Past Surgical History: No date: ABDOMINAL HYSTERECTOMY 09/08/2019: EYE SURGERY; Bilateral     Comment:  cataract extraction 02/04/2012: Hallux rigidus of left foot No date: TUBAL LIGATION  BMI    Body Mass Index: 38.62 kg/m      Reproductive/Obstetrics negative OB ROS                            Anesthesia Physical Anesthesia Plan  ASA: 3  Anesthesia Plan: General   Post-op Pain  Management:    Induction: Intravenous  PONV Risk Score and Plan: Propofol infusion and TIVA  Airway Management Planned: Nasal Cannula  Additional Equipment:   Intra-op Plan:   Post-operative Plan:   Informed Consent: I have reviewed the patients History and Physical, chart, labs and discussed the procedure including the risks, benefits and alternatives for the proposed anesthesia with the patient or authorized representative who has indicated his/her understanding and acceptance.     Dental Advisory Given  Plan Discussed with: CRNA and Surgeon  Anesthesia Plan Comments:         Anesthesia Quick Evaluation

## 2020-12-10 NOTE — Op Note (Signed)
[facility] Patient Name: [facility] Procedure Date: 12/10/2020 9:28 AM MRN: [id number] Account #: [id number] Date of Birth: 1970-07-30 Admit Type: Outpatient Age: 50 Room: [facility] ENDO ROOM 2 Gender: Female Note Status: Finalized Instrument Name: Park Meo [id number] Procedure:             Colonoscopy Indications:           Screening for colon cancer: Family history of colon                         polyps in distant relative(s) Providers:             Rueben Bash, DO Referring MD:          Tracie Harrier, MD (Referring MD) Medicines:             Monitored Anesthesia Care Complications:         No immediate complications. Estimated blood loss: None. Procedure:             Pre-Anesthesia Assessment:                        - Prior to the procedure, a History and Physical was                         performed, and patient medications and allergies were                         reviewed. The patient is competent. The risks and                         benefits of the procedure and the sedation options and                         risks were discussed with the patient. All questions                         were answered and informed consent was obtained.                         Patient identification and proposed procedure were                         verified by the physician, the nurse, the anesthetist                         and the technician in the endoscopy suite. Mental                         Status Examination: alert and oriented. Airway                         Examination: normal oropharyngeal airway and neck                         mobility. Respiratory Examination: clear to                         auscultation. CV Examination: RRR, no murmurs, no S3  or S4. Prophylactic Antibiotics: The patient does not                         require prophylactic antibiotics. Prior                         Anticoagulants: The  patient has taken no previous                         anticoagulant or antiplatelet agents. ASA Grade                         Assessment: III - A patient with severe systemic                         disease. After reviewing the risks and benefits, the                         patient was deemed in satisfactory condition to                         undergo the procedure. The anesthesia plan was to use                         monitored anesthesia care (MAC). Immediately prior to                         administration of medications, the patient was                         re-assessed for adequacy to receive sedatives. The                         heart rate, respiratory rate, oxygen saturations,                         blood pressure, adequacy of pulmonary ventilation, and                         response to care were monitored throughout the                         procedure. The physical status of the patient was                         re-assessed after the procedure.                        After obtaining informed consent, the colonoscope was                         passed under direct vision. Throughout the procedure,                         the patient's blood pressure, pulse, and oxygen                         saturations were monitored continuously. The  Colonoscope was introduced through the anus and                         advanced to the the cecum, identified by appendiceal                         orifice and ileocecal valve. The colonoscopy was                         performed without difficulty. The patient tolerated                         the procedure well. The quality of the bowel                         preparation was evaluated using the BBPS [facility] Bowel                         Preparation Scale) with scores of: Right Colon = 2                         (minor amount of residual staining, small fragments of                         stool and/or opaque  liquid, but mucosa seen well),                         Transverse Colon = 3 (entire mucosa seen well with no                         residual staining, small fragments of stool or opaque                         liquid) and Left Colon = 3 (entire mucosa seen well                         with no residual staining, small fragments of stool or                         opaque liquid). The total BBPS score equals 8. The                         quality of the bowel preparation was excellent. Findings:      The perianal and digital rectal examinations were normal. Pertinent       negatives include normal sphincter tone.      Non-bleeding internal hemorrhoids were found during retroflexion. The       hemorrhoids were Grade I (internal hemorrhoids that do not prolapse).      The exam was otherwise without abnormality on direct and retroflexion       views. Impression:            - Non-bleeding internal hemorrhoids.                        - The examination was otherwise normal on direct and  retroflexion views.                        - No specimens collected. Recommendation:        - Discharge patient to home.                        - Resume previous diet.                        - Continue present medications.                        - Repeat colonoscopy in 5 years for screening purposes.                        - Return to primary care physician as previously                         scheduled. Procedure Code(s):     --- Professional ---                        [id number], Colonoscopy, flexible; diagnostic, including                         collection of specimen(s) by brushing or washing, when                         performed (separate procedure) Diagnosis Code(s):     --- Professional ---                        Z12.11, Encounter for screening for malignant neoplasm                         of colon                        Z83.71, Family history of colonic polyps                         K64.0, First degree hemorrhoids CPT copyright 2019 [employer]. All rights reserved. The codes documented in this report are preliminary and upon coder review may  be revised to meet current compliance requirements. Attending Participation:      I personally performed the entire procedure. Volney American, DO Annamaria Helling DO, DO 12/10/2020 10:02:16 AM This report has been signed electronically. Number of Addenda: 0 Note Initiated On: 12/10/2020 9:28 AM Scope Withdrawal Time: 0 hours 10 minutes 34 seconds  Total Procedure Duration: 0 hours 20 minutes 6 seconds  Estimated Blood Loss:  Estimated blood loss: none.      [facility]

## 2020-12-10 NOTE — Transfer of Care (Signed)
Immediate Anesthesia Transfer of Care Note  Patient: Kristen Mcmahon  Procedure(s) Performed: COLONOSCOPY WITH PROPOFOL  Patient Location: PACU  Anesthesia Type:General  Level of Consciousness: awake, alert  and oriented  Airway & Oxygen Therapy: Patient Spontanous Breathing  Post-op Assessment: Report given to RN and Post -op Vital signs reviewed and stable  Post vital signs: Reviewed and stable  Last Vitals:  Vitals Value Taken Time  BP    Temp    Pulse 103 12/10/20 1001  Resp 23 12/10/20 1001  SpO2 97 % 12/10/20 1001  Vitals shown include unvalidated device data.  Last Pain:  Vitals:   12/10/20 0818  TempSrc: Temporal  PainSc: 0-No pain         Complications: No notable events documented.

## 2020-12-10 NOTE — Interval H&P Note (Signed)
History and Physical Interval Note: Preprocedure H&P from 12/10/20  was reviewed and there was no interval change after seeing and examining the patient.  Written consent was obtained from the patient after discussion of risks, benefits, and alternatives. Patient has consented to proceed with Colonoscopy with possible intervention   12/10/2020 8:24 AM  Kristen Mcmahon  has presented today for surgery, with the diagnosis of Z83.71 Family History of Colon Polyps (Father).  The various methods of treatment have been discussed with the patient and family. After consideration of risks, benefits and other options for treatment, the patient has consented to  Procedure(s): COLONOSCOPY WITH PROPOFOL (N/A) as a surgical intervention.  The patient's history has been reviewed, patient examined, no change in status, stable for surgery.  I have reviewed the patient's chart and labs.  Questions were answered to the patient's satisfaction.     Annamaria Helling

## 2020-12-11 ENCOUNTER — Encounter: Payer: Self-pay | Admitting: Gastroenterology

## 2020-12-12 ENCOUNTER — Encounter: Payer: Self-pay | Admitting: Oncology

## 2020-12-12 ENCOUNTER — Inpatient Hospital Stay: Payer: 59 | Attending: Oncology

## 2020-12-12 ENCOUNTER — Other Ambulatory Visit: Payer: Self-pay

## 2020-12-12 ENCOUNTER — Other Ambulatory Visit: Payer: 59

## 2020-12-12 ENCOUNTER — Ambulatory Visit: Payer: 59 | Admitting: Oncology

## 2020-12-12 ENCOUNTER — Inpatient Hospital Stay (HOSPITAL_BASED_OUTPATIENT_CLINIC_OR_DEPARTMENT_OTHER): Payer: 59 | Admitting: Oncology

## 2020-12-12 VITALS — BP 131/90 | HR 88 | Temp 98.8°F | Resp 18 | Ht 64.0 in | Wt 228.6 lb

## 2020-12-12 DIAGNOSIS — J449 Chronic obstructive pulmonary disease, unspecified: Secondary | ICD-10-CM | POA: Diagnosis not present

## 2020-12-12 DIAGNOSIS — D75839 Thrombocytosis, unspecified: Secondary | ICD-10-CM

## 2020-12-12 DIAGNOSIS — E785 Hyperlipidemia, unspecified: Secondary | ICD-10-CM | POA: Insufficient documentation

## 2020-12-12 DIAGNOSIS — K219 Gastro-esophageal reflux disease without esophagitis: Secondary | ICD-10-CM | POA: Insufficient documentation

## 2020-12-12 DIAGNOSIS — I10 Essential (primary) hypertension: Secondary | ICD-10-CM | POA: Insufficient documentation

## 2020-12-12 DIAGNOSIS — I359 Nonrheumatic aortic valve disorder, unspecified: Secondary | ICD-10-CM | POA: Insufficient documentation

## 2020-12-12 DIAGNOSIS — D72829 Elevated white blood cell count, unspecified: Secondary | ICD-10-CM

## 2020-12-12 DIAGNOSIS — Z87891 Personal history of nicotine dependence: Secondary | ICD-10-CM | POA: Insufficient documentation

## 2020-12-12 LAB — CBC WITH DIFFERENTIAL/PLATELET
Abs Immature Granulocytes: 0.03 10*3/uL (ref 0.00–0.07)
Basophils Absolute: 0.1 10*3/uL (ref 0.0–0.1)
Basophils Relative: 1 %
Eosinophils Absolute: 0.2 10*3/uL (ref 0.0–0.5)
Eosinophils Relative: 2 %
HCT: 40.4 % (ref 36.0–46.0)
Hemoglobin: 13.5 g/dL (ref 12.0–15.0)
Immature Granulocytes: 0 %
Lymphocytes Relative: 30 %
Lymphs Abs: 3.1 10*3/uL (ref 0.7–4.0)
MCH: 30.4 pg (ref 26.0–34.0)
MCHC: 33.4 g/dL (ref 30.0–36.0)
MCV: 91 fL (ref 80.0–100.0)
Monocytes Absolute: 0.7 10*3/uL (ref 0.1–1.0)
Monocytes Relative: 6 %
Neutro Abs: 6.3 10*3/uL (ref 1.7–7.7)
Neutrophils Relative %: 61 %
Platelets: 467 10*3/uL — ABNORMAL HIGH (ref 150–400)
RBC: 4.44 MIL/uL (ref 3.87–5.11)
RDW: 13.2 % (ref 11.5–15.5)
WBC: 10.3 10*3/uL (ref 4.0–10.5)
nRBC: 0 % (ref 0.0–0.2)

## 2020-12-12 LAB — COMPREHENSIVE METABOLIC PANEL
ALT: 48 U/L — ABNORMAL HIGH (ref 0–44)
AST: 40 U/L (ref 15–41)
Albumin: 4.4 g/dL (ref 3.5–5.0)
Alkaline Phosphatase: 76 U/L (ref 38–126)
Anion gap: 11 (ref 5–15)
BUN: 10 mg/dL (ref 6–20)
CO2: 24 mmol/L (ref 22–32)
Calcium: 9.5 mg/dL (ref 8.9–10.3)
Chloride: 101 mmol/L (ref 98–111)
Creatinine, Ser: 0.67 mg/dL (ref 0.44–1.00)
GFR, Estimated: >60 mL/min (ref >60)
Glucose, Bld: 111 mg/dL — ABNORMAL HIGH (ref 70–99)
Potassium: 3.8 mmol/L (ref 3.5–5.1)
Sodium: 136 mmol/L (ref 135–145)
Total Bilirubin: 0.5 mg/dL (ref 0.3–1.2)
Total Protein: 8.4 g/dL — ABNORMAL HIGH (ref 6.5–8.1)

## 2020-12-12 LAB — IRON AND TIBC
Iron: 93 ug/dL (ref 28–170)
Saturation Ratios: 25 % (ref 10.4–31.8)
TIBC: 371 ug/dL (ref 250–450)
UIBC: 278 ug/dL

## 2020-12-12 LAB — FERRITIN: Ferritin: 505 ng/mL — ABNORMAL HIGH (ref 11–307)

## 2020-12-12 NOTE — Progress Notes (Signed)
Hematology/Oncology Consult note [facility]  Telephone:([phone] [phone]  Patient Care Team: Tracie Harrier, MD as PCP - General (Internal Medicine)   Name of the patient: Kristen Mcmahon  [id number]  1970/11/11   Date of visit: 12/12/20  Diagnosis-leukocytosis/thrombocytosis likely reactive  Chief complaint/ Reason for visit-routine follow-up of leukocytosis and thrombocytosis  Heme/Onc history: Patient is a 50 year old female previously followed by Dr. Mike Gip for leukocytosis and thrombocytosis.  She has had stable white cell count ranging between 11-16 over the last 5 to 6 years.  JAK2 mutation testing, BCR ABL testing negative.  CAL R and MPL mutation testing negative.  She also has mild thrombocytosis with a platelet count that usually fluctuates in the 400s.  History of mild iron deficiency anemia for which she has been on oral iron.  Interval history-presently patient reports doing well overall.  She does have chronic hoarseness of voice related to her asthma medications.  Denies any worsening shortness of breath  ECOG PS- 1 Pain scale- 0   Review of systems- Review of Systems  Constitutional:  Positive for malaise/fatigue. Negative for chills, fever and weight loss.  HENT:  Negative for congestion, ear discharge and nosebleeds.   Eyes:  Negative for blurred vision.  Respiratory:  Negative for cough, hemoptysis, sputum production, shortness of breath and wheezing.   Cardiovascular:  Negative for chest pain, palpitations, orthopnea and claudication.  Gastrointestinal:  Negative for abdominal pain, blood in stool, constipation, diarrhea, heartburn, melena, nausea and vomiting.  Genitourinary:  Negative for dysuria, flank pain, frequency, hematuria and urgency.  Musculoskeletal:  Negative for back pain, joint pain and myalgias.  Skin:  Negative for rash.  Neurological:  Negative for dizziness, tingling, focal weakness, seizures,  weakness and headaches.  Endo/Heme/Allergies:  Does not bruise/bleed easily.  Psychiatric/Behavioral:  Negative for depression and suicidal ideas. The patient does not have insomnia.      Allergies  Allergen Reactions   Sulfa Antibiotics Rash   Tetracycline Other (See Comments)    Caused migraines     Past Medical History:  Diagnosis Date   Anemia    Anxiety    Aortic valve disorder    Asthma    COPD (chronic obstructive pulmonary disease) (HCC)    Dysuria    FOP (fibrodysplasia ossificans progressiva)    GERD (gastroesophageal reflux disease)    Gross hematuria    HLD (hyperlipidemia)    Hypertension    Kidney stones    Migraines    Reflux    Rhinitis    UTI (lower urinary tract infection)      Past Surgical History:  Procedure Laterality Date   ABDOMINAL HYSTERECTOMY     COLONOSCOPY WITH PROPOFOL N/A 12/10/2020   Procedure: COLONOSCOPY WITH PROPOFOL;  Surgeon: Annamaria Helling, DO;  Location: [facility] ENDOSCOPY;  Service: Gastroenterology;  Laterality: N/A;   EYE SURGERY Bilateral 09/08/2019   cataract extraction   Hallux rigidus of left foot  02/04/2012   TUBAL LIGATION      Social History   Socioeconomic History   Marital status: Married    Spouse name: Not on file   Number of children: Not on file   Years of education: Not on file   Highest education level: Not on file  Occupational History   Not on file  Tobacco Use   Smoking status: Former    Types: Cigarettes    Quit date: 1995    Years since quitting: 27.8   Smokeless tobacco: Never  Tobacco comments:    Quit 25 Years  Vaping Use   Vaping Use: Never used  Substance and Sexual Activity   Alcohol use: Yes    Alcohol/week: 0.0 standard drinks    Comment: occasional   Drug use: No   Sexual activity: Not on file  Other Topics Concern   Not on file  Social History Narrative   Not on file   Social Determinants of Health   Financial Resource Strain: Not on file  Food Insecurity: Not on  file  Transportation Needs: Not on file  Physical Activity: Not on file  Stress: Not on file  Social Connections: Not on file  Intimate Partner Violence: Not on file    Family History  Problem Relation Age of Onset   Bladder Cancer Father    Diabetes Mellitus II Father    Prostate cancer Father    Hypertension Father    Leukemia Paternal Grandmother    Kidney disease Neg Hx    Kidney cancer Neg Hx    Breast cancer Neg Hx      Current Outpatient Medications:    aspirin EC 81 MG tablet, Take 81 mg by mouth daily., Disp: , Rfl:    baclofen (LIORESAL) 20 MG tablet, Take 20 mg by mouth 3 (three) times daily. , Disp: , Rfl:    COMBIVENT RESPIMAT 20-100 MCG/ACT AERS respimat, SMARTSIG:2 Inhalation Via Inhaler 4 Times Daily, Disp: , Rfl:    cycloSPORINE (RESTASIS) 0.05 % ophthalmic emulsion, Apply 1 drop to eye 2 (two) times daily. , Disp: , Rfl:    Fluticasone-Salmeterol (ADVAIR) 250-50 MCG/DOSE AEPB, 1 puff bid, Disp: , Rfl:    iron polysaccharides (NIFEREX) 150 MG capsule, Take 150 mg by mouth 2 (two) times daily. , Disp: , Rfl:    lamoTRIgine (LAMICTAL) 200 MG tablet, Take 400 mg by mouth daily. , Disp: , Rfl:    losartan-hydrochlorothiazide (HYZAAR) 50-12.5 MG tablet, Take 1 tablet by mouth daily. , Disp: , Rfl:    montelukast (SINGULAIR) 10 MG tablet, Take by mouth., Disp: , Rfl:    Omega-3 1000 MG CAPS, Take 1 capsule by mouth daily. , Disp: , Rfl:    polyethylene glycol-electrolytes (NULYTELY) 420 g solution, Take by mouth., Disp: , Rfl:    simvastatin (ZOCOR) 20 MG tablet, Take 20 mg by mouth daily., Disp: , Rfl:    SUMAtriptan (IMITREX) 100 MG tablet, Take 100 mg by mouth as needed. , Disp: , Rfl:    Vitamin D, Ergocalciferol, (DRISDOL) 50000 UNITS CAPS capsule, Take 50,000 Units by mouth every 7 (seven) days. , Disp: , Rfl:    promethazine (PHENERGAN) 50 MG tablet, Take by mouth. (Patient not taking: No sig reported), Disp: , Rfl:   Physical exam:  Vitals:   12/12/20 1105   BP: 131/90  Pulse: 88  Resp: 18  Temp: 98.8 F (37.1 C)  SpO2: 99%  Weight: 228 lb 9.6 oz (103.7 kg)  Height: _0  (1.626 m)   Physical Exam Cardiovascular:     Rate and Rhythm: Normal rate and regular rhythm.     Heart sounds: Normal heart sounds.  Pulmonary:     Effort: Pulmonary effort is normal.     Breath sounds: Normal breath sounds.  Abdominal:     General: Bowel sounds are normal.     Palpations: Abdomen is soft.  Skin:    General: Skin is warm and dry.  Neurological:     Mental Status: She is alert and oriented to person,  place, and time.     CMP Latest Ref Rng & Units 12/12/2020  Glucose 70 - 99 mg/dL 111(H)  BUN 6 - 20 mg/dL 10  Creatinine 0.44 - 1.00 mg/dL 0.67  Sodium 135 - 145 mmol/L 136  Potassium 3.5 - 5.1 mmol/L 3.8  Chloride 98 - 111 mmol/L 101  CO2 22 - 32 mmol/L 24  Calcium 8.9 - 10.3 mg/dL 9.5  Total Protein 6.5 - 8.1 g/dL 8.4(H)  Total Bilirubin 0.3 - 1.2 mg/dL 0.5  Alkaline Phos 38 - 126 U/L 76  AST 15 - 41 U/L 40  ALT 0 - 44 U/L 48(H)   CBC Latest Ref Rng & Units 12/12/2020  WBC 4.0 - 10.5 K/uL 10.3  Hemoglobin 12.0 - 15.0 g/dL 13.5  Hematocrit 36.0 - 46.0 % 40.4  Platelets 150 - 400 K/uL 467(H)    No images are attached to the encounter.  DG Toe 5th Left  Result Date: 11/23/2020 CLINICAL DATA:  Stubbed toe EXAM: DG TOE 5TH LEFT COMPARISON:  None. FINDINGS: Acute mildly comminuted and displaced intra-articular fracture involving the head of the fifth proximal phalanx. No subluxation IMPRESSION: Acute comminuted intra-articular fracture involving head of fifth proximal phalanx Electronically Signed   By: Donavan Foil M.D.   On: 11/23/2020 19:57     Assessment and plan- Patient is a 50 y.o. female here for routine follow-up of leukocytosis and thrombocytosis  Patient's white cell count has been between 11-15 at least dating back to the last 7 years.  Differential is mainly showed neutrophilia with an unclear increase in her white cell  count trend.  Today it is normal at 10.3.  Platelets stable in the 400s to 500s.  JAK2, exon 12, CAL R, MPL and BCR ABL testing negative in the past.  Given the stability of her counts this can be monitored conservatively without the need for a bone marrow biopsy.  She is also not anemic and her hemoglobin is remained stable around 13 for the last 2 years.  Iron studies show normal iron saturation.  Ferritin levels elevated at 505 likely secondary to underlying medical conditions and reactive.  Does not require follow-up   Visit Diagnosis 1. Thrombocytosis   2. Leukocytosis, unspecified type      Dr. Randa Evens, MD, MPH [facility] [phone] 12/12/2020 4:49 PM

## 2020-12-13 ENCOUNTER — Other Ambulatory Visit: Payer: Self-pay

## 2020-12-13 DIAGNOSIS — D75839 Thrombocytosis, unspecified: Secondary | ICD-10-CM

## 2021-02-11 ENCOUNTER — Telehealth: Payer: Self-pay | Admitting: Urology

## 2021-02-19 NOTE — Telephone Encounter (Signed)
Error

## 2021-03-15 ENCOUNTER — Inpatient Hospital Stay: Payer: 59 | Attending: Oncology

## 2021-03-15 ENCOUNTER — Other Ambulatory Visit: Payer: Self-pay

## 2021-03-15 DIAGNOSIS — D473 Essential (hemorrhagic) thrombocythemia: Secondary | ICD-10-CM | POA: Diagnosis not present

## 2021-03-15 DIAGNOSIS — D75839 Thrombocytosis, unspecified: Secondary | ICD-10-CM

## 2021-03-15 LAB — COMPREHENSIVE METABOLIC PANEL
ALT: 35 U/L (ref 0–44)
AST: 28 U/L (ref 15–41)
Albumin: 4.4 g/dL (ref 3.5–5.0)
Alkaline Phosphatase: 97 U/L (ref 38–126)
Anion gap: 12 (ref 5–15)
BUN: 14 mg/dL (ref 6–20)
CO2: 23 mmol/L (ref 22–32)
Calcium: 9.9 mg/dL (ref 8.9–10.3)
Chloride: 101 mmol/L (ref 98–111)
Creatinine, Ser: 0.62 mg/dL (ref 0.44–1.00)
GFR, Estimated: >60 mL/min (ref >60)
Glucose, Bld: 97 mg/dL (ref 70–99)
Potassium: 3.3 mmol/L — ABNORMAL LOW (ref 3.5–5.1)
Sodium: 136 mmol/L (ref 135–145)
Total Bilirubin: 0.5 mg/dL (ref 0.3–1.2)
Total Protein: 9 g/dL — ABNORMAL HIGH (ref 6.5–8.1)

## 2021-03-15 LAB — CBC WITH DIFFERENTIAL/PLATELET
Abs Immature Granulocytes: 0.06 10*3/uL (ref 0.00–0.07)
Basophils Absolute: 0.1 10*3/uL (ref 0.0–0.1)
Basophils Relative: 1 %
Eosinophils Absolute: 0.2 10*3/uL (ref 0.0–0.5)
Eosinophils Relative: 2 %
HCT: 40.3 % (ref 36.0–46.0)
Hemoglobin: 13.6 g/dL (ref 12.0–15.0)
Immature Granulocytes: 0 %
Lymphocytes Relative: 20 %
Lymphs Abs: 2.7 10*3/uL (ref 0.7–4.0)
MCH: 30.8 pg (ref 26.0–34.0)
MCHC: 33.7 g/dL (ref 30.0–36.0)
MCV: 91.2 fL (ref 80.0–100.0)
Monocytes Absolute: 0.8 10*3/uL (ref 0.1–1.0)
Monocytes Relative: 6 %
Neutro Abs: 10.1 10*3/uL — ABNORMAL HIGH (ref 1.7–7.7)
Neutrophils Relative %: 71 %
Platelets: 454 10*3/uL — ABNORMAL HIGH (ref 150–400)
RBC: 4.42 MIL/uL (ref 3.87–5.11)
RDW: 12.8 % (ref 11.5–15.5)
WBC: 13.9 10*3/uL — ABNORMAL HIGH (ref 4.0–10.5)
nRBC: 0 % (ref 0.0–0.2)

## 2021-03-15 LAB — FERRITIN: Ferritin: 352 ng/mL — ABNORMAL HIGH (ref 11–307)

## 2021-03-26 ENCOUNTER — Other Ambulatory Visit: Payer: Self-pay | Admitting: Obstetrics and Gynecology

## 2021-03-26 DIAGNOSIS — R921 Mammographic calcification found on diagnostic imaging of breast: Secondary | ICD-10-CM

## 2021-03-28 NOTE — Progress Notes (Signed)
03/29/21 11:19 AM   Kristen Mcmahon Mar 11, 1970 [id number]  Referring provider:  Tracie Harrier, MD [address] [facility] [place],  [place] [address] Chief Complaint  Patient presents with   Follow-up     HPI: Kristen Mcmahon is a 51 y.o.female with a personal history of microscopic hematuria, UPJ obstruction, and constipation who presents today for discussion of cystoscopy.  She has a personal history of microscopic hematuria as well as incontinence and would like to discuss each of these today.  She also has a remote history of kidney stones.   She's had several workups in the past including a CT urogram in 2014 and cystoscopy in 12/2014 which were negative. She was last seen in clinic on 06/06/2016 for a cystoscopy that was unremarkable.   She is concerned today because she believes that she has had blood in her urine consistently since her last hematuria evaluation.  Review of care everywhere in my chart indicate that she has had multiple urinalyses most of which do show some blood on urine dip but no microscopic blood since 2021 on numerous occasions.  She has 1 urinalysis a few years ago that is consistent with urinary tract infection.  Her incontinence is fairly new. She has leakage when she laugh, coughs, or sneezes. When she has a bad coughing fit she experiences a large amount of leakage.  This is increasingly bothersome to her.  She wears several pads per day which sometimes can be saturated.  She denies any urgency frequency and urgency. She has not tried pelvic floor exercises.   She mentions today that she is status post hysterectomy.  She denies any vaginal symptoms.  PMH: Past Medical History:  Diagnosis Date   Anemia    Anxiety    Aortic valve disorder    Asthma    COPD (chronic obstructive pulmonary disease) (HCC)    Dysuria    FOP (fibrodysplasia ossificans progressiva)    GERD (gastroesophageal reflux disease)    Gross hematuria    HLD  (hyperlipidemia)    Hypertension    Kidney stones    Migraines    Reflux    Rhinitis    UTI (lower urinary tract infection)     Surgical History: Past Surgical History:  Procedure Laterality Date   ABDOMINAL HYSTERECTOMY     COLONOSCOPY WITH PROPOFOL N/A 12/10/2020   Procedure: COLONOSCOPY WITH PROPOFOL;  Surgeon: Annamaria Helling, DO;  Location: [facility] ENDOSCOPY;  Service: Gastroenterology;  Laterality: N/A;   EYE SURGERY Bilateral 09/08/2019   cataract extraction   Hallux rigidus of left foot  02/04/2012   TUBAL LIGATION      Home Medications:  Allergies as of 03/29/2021       Reactions   Sulfa Antibiotics Rash   Tetracycline Other (See Comments)   Caused migraines        Medication List        Accurate as of March 29, 2021 11:19 AM. If you have any questions, ask your nurse or doctor.          STOP taking these medications    cycloSPORINE 0.05 % ophthalmic emulsion Commonly known as: RESTASIS Stopped by: Hollice Espy, MD   iron polysaccharides 150 MG capsule Commonly known as: NIFEREX Stopped by: Hollice Espy, MD   polyethylene glycol-electrolytes 420 g solution Commonly known as: NuLYTELY Stopped by: Hollice Espy, MD       TAKE these medications    aspirin EC 81 MG tablet Take  81 mg by mouth daily.   baclofen 20 MG tablet Commonly known as: LIORESAL Take 20 mg by mouth 3 (three) times daily.   Combivent Respimat 20-100 MCG/ACT Aers respimat Generic drug: Ipratropium-Albuterol SMARTSIG:2 Inhalation Via Inhaler 4 Times Daily   Fluticasone-Salmeterol 250-50 MCG/DOSE Aepb Commonly known as: ADVAIR 1 puff bid   lamoTRIgine 200 MG tablet Commonly known as: LAMICTAL Take 400 mg by mouth daily.   losartan-hydrochlorothiazide 50-12.5 MG tablet Commonly known as: HYZAAR Take 1 tablet by mouth daily.   montelukast 10 MG tablet Commonly known as: SINGULAIR Take by mouth.   Omega-3 1000 MG Caps Take 1 capsule by mouth daily.    promethazine 50 MG tablet Commonly known as: PHENERGAN Take by mouth.   simvastatin 20 MG tablet Commonly known as: ZOCOR Take 20 mg by mouth daily.   SUMAtriptan 100 MG tablet Commonly known as: IMITREX Take 100 mg by mouth as needed.   Vitamin D (Ergocalciferol) 1.25 MG (50000 UNIT) Caps capsule Commonly known as: DRISDOL Take 50,000 Units by mouth every 7 (seven) days.        Allergies:  Allergies  Allergen Reactions   Sulfa Antibiotics Rash   Tetracycline Other (See Comments)    Caused migraines    Family History: Family History  Problem Relation Age of Onset   Bladder Cancer Father    Diabetes Mellitus II Father    Prostate cancer Father    Hypertension Father    Leukemia Paternal Grandmother    Kidney disease Neg Hx    Kidney cancer Neg Hx    Breast cancer Neg Hx     Social History:  reports that she quit smoking about 28 years ago. Her smoking use included cigarettes. She has never used smokeless tobacco. She reports current alcohol use. She reports that she does not use drugs.   Physical Exam: BP 132/78    Pulse 97    Ht 5\' 4"  (1.626 m)    Wt 228 lb (103.4 kg)    BMI 39.14 kg/m   Constitutional:  Alert and oriented, No acute distress. HEENT: [place] AT, moist mucus membranes.  Trachea midline, no masses. Cardiovascular: No clubbing, cyanosis, or edema. Respiratory: Normal respiratory effort, no increased work of breathing. Skin: No rashes, bruises or suspicious lesions. Neurologic: Grossly intact, no focal deficits, moving all 4 extremities. Psychiatric: Normal mood and affect.  Laboratory Data: Lab Results  Component Value Date   CREATININE 0.62 03/15/2021    Urinalysis Component     Latest Ref Rng & Units 03/29/2021  Color, Urine     YELLOW YELLOW  Appearance     CLEAR CLEAR  Specific Gravity, Urine     1.005 - 1.030 1.020  pH     5.0 - 8.0 8.0  Glucose, UA     NEGATIVE mg/dL NEGATIVE  Hgb urine dipstick     NEGATIVE TRACE (A)   Bilirubin Urine     NEGATIVE NEGATIVE  Ketones, ur     NEGATIVE mg/dL NEGATIVE  Protein     NEGATIVE mg/dL NEGATIVE  Nitrite     NEGATIVE NEGATIVE  Leukocytes,Ua     NEGATIVE TRACE (A)  Squamous Epithelial / LPF     0 - 5 11-20  WBC, UA     0 - 5 WBC/hpf 6-10  RBC / HPF     0 - 5 RBC/hpf 0-5  Bacteria, UA     NONE SEEN FEW (A)  Budding Yeast      PRESENT  Assessment & Plan:   Stress incontinence  - Bothersome - Discussed how stress incontinence that happens when physical movement or activity like coughing, laughing, sneezing, running or heavy lifting puts pressure on your bladder, causing you to leak urine in the absence of good structural support. - Discussed importance of building pelvic floor muscles and recommend she start pelvic floor exercises (Kegel). We also discussed surgery such as midureteral's sling placement versus urethral bulking agents and which I do not personally specialize been happy to place referral to my colleague, [place] her a referral to Dr. Matilde Sprang to further discuss surgical intervention -We also discussed the importance of weight loss, declined PT referral  2. History of microscopic hematuria  - Urinalysis today negative for microscopic hematuria  - It has been 2 full years with no evidence of microscopic blood on urinalysis which does not warrant a cystoscopy at this time, additionally she is had multiple negative cystoscopies on previous occasions -UA today is contaminated appearing, in the absence of symptoms will not treat    I,Kailey Littlejohn,acting as a scribe for Hollice Espy, MD.,have documented all relevant documentation on the behalf of Hollice Espy, MD,as directed by  Hollice Espy, MD while in the presence of Hollice Espy, MD.  I have reviewed the above documentation for accuracy and completeness, and I agree with the above.   Hollice Espy, MD   [facility] [address]  [place], [place] [address] [phone]  I spent 45 total minutes on the day of the encounter including pre-visit review of the medical record, face-to-face time with the patient, and post visit ordering of labs/imaging/tests.

## 2021-03-29 ENCOUNTER — Encounter: Payer: Self-pay | Admitting: Urology

## 2021-03-29 ENCOUNTER — Ambulatory Visit: Payer: 59 | Admitting: Urology

## 2021-03-29 ENCOUNTER — Other Ambulatory Visit: Payer: Self-pay

## 2021-03-29 ENCOUNTER — Other Ambulatory Visit
Admission: RE | Admit: 2021-03-29 | Discharge: 2021-03-29 | Disposition: A | Discharge status: Home / Self Care | Payer: 59 | Attending: Urology | Admitting: Urology

## 2021-03-29 VITALS — BP 132/78 | HR 97 | Ht 64.0 in | Wt 228.0 lb

## 2021-03-29 DIAGNOSIS — N393 Stress incontinence (female) (male): Secondary | ICD-10-CM

## 2021-03-29 DIAGNOSIS — N029 Recurrent and persistent hematuria with unspecified morphologic changes: Secondary | ICD-10-CM

## 2021-03-29 DIAGNOSIS — R3129 Other microscopic hematuria: Secondary | ICD-10-CM | POA: Diagnosis present

## 2021-03-29 LAB — URINALYSIS, COMPLETE (UACMP) WITH MICROSCOPIC
Bilirubin Urine: NEGATIVE
Glucose, UA: NEGATIVE mg/dL
Ketones, ur: NEGATIVE mg/dL
Nitrite: NEGATIVE
Protein, ur: NEGATIVE mg/dL
Specific Gravity, Urine: 1.02 (ref 1.005–1.030)
pH: 8 (ref 5.0–8.0)

## 2021-04-15 ENCOUNTER — Ambulatory Visit
Admission: RE | Admit: 2021-04-15 | Discharge: 2021-04-15 | Disposition: A | Discharge status: Home / Self Care | Payer: 59 | Source: Ambulatory Visit | Attending: Obstetrics and Gynecology | Admitting: Obstetrics and Gynecology

## 2021-04-15 ENCOUNTER — Other Ambulatory Visit: Payer: Self-pay

## 2021-04-15 DIAGNOSIS — R921 Mammographic calcification found on diagnostic imaging of breast: Secondary | ICD-10-CM | POA: Insufficient documentation

## 2021-05-06 ENCOUNTER — Ambulatory Visit: Payer: 59 | Admitting: Urology

## 2021-06-12 ENCOUNTER — Encounter: Payer: Self-pay | Admitting: Oncology

## 2021-06-12 ENCOUNTER — Inpatient Hospital Stay: Payer: 59 | Admitting: Oncology

## 2021-06-12 ENCOUNTER — Inpatient Hospital Stay: Payer: 59 | Attending: Oncology

## 2021-06-12 VITALS — BP 123/85 | HR 91 | Temp 98.2°F | Resp 16 | Ht 64.0 in | Wt 230.0 lb

## 2021-06-12 DIAGNOSIS — D75839 Thrombocytosis, unspecified: Secondary | ICD-10-CM | POA: Insufficient documentation

## 2021-06-12 DIAGNOSIS — D72829 Elevated white blood cell count, unspecified: Secondary | ICD-10-CM | POA: Diagnosis not present

## 2021-06-12 LAB — COMPREHENSIVE METABOLIC PANEL
ALT: 37 U/L (ref 0–44)
AST: 34 U/L (ref 15–41)
Albumin: 4.3 g/dL (ref 3.5–5.0)
Alkaline Phosphatase: 85 U/L (ref 38–126)
Anion gap: 9 (ref 5–15)
BUN: 16 mg/dL (ref 6–20)
CO2: 25 mmol/L (ref 22–32)
Calcium: 9.9 mg/dL (ref 8.9–10.3)
Chloride: 100 mmol/L (ref 98–111)
Creatinine, Ser: 0.61 mg/dL (ref 0.44–1.00)
GFR, Estimated: >60 mL/min (ref >60)
Glucose, Bld: 106 mg/dL — ABNORMAL HIGH (ref 70–99)
Potassium: 4 mmol/L (ref 3.5–5.1)
Sodium: 134 mmol/L — ABNORMAL LOW (ref 135–145)
Total Bilirubin: 0.9 mg/dL (ref 0.3–1.2)
Total Protein: 8.5 g/dL — ABNORMAL HIGH (ref 6.5–8.1)

## 2021-06-12 LAB — CBC WITH DIFFERENTIAL/PLATELET
Abs Immature Granulocytes: 0.08 10*3/uL — ABNORMAL HIGH (ref 0.00–0.07)
Basophils Absolute: 0.1 10*3/uL (ref 0.0–0.1)
Basophils Relative: 1 %
Eosinophils Absolute: 0.3 10*3/uL (ref 0.0–0.5)
Eosinophils Relative: 3 %
HCT: 40.6 % (ref 36.0–46.0)
Hemoglobin: 13.5 g/dL (ref 12.0–15.0)
Immature Granulocytes: 1 %
Lymphocytes Relative: 18 %
Lymphs Abs: 2.4 10*3/uL (ref 0.7–4.0)
MCH: 30.3 pg (ref 26.0–34.0)
MCHC: 33.3 g/dL (ref 30.0–36.0)
MCV: 91 fL (ref 80.0–100.0)
Monocytes Absolute: 0.7 10*3/uL (ref 0.1–1.0)
Monocytes Relative: 5 %
Neutro Abs: 9.6 10*3/uL — ABNORMAL HIGH (ref 1.7–7.7)
Neutrophils Relative %: 72 %
Platelets: 443 10*3/uL — ABNORMAL HIGH (ref 150–400)
RBC: 4.46 MIL/uL (ref 3.87–5.11)
RDW: 13.1 % (ref 11.5–15.5)
WBC: 13.2 10*3/uL — ABNORMAL HIGH (ref 4.0–10.5)
nRBC: 0 % (ref 0.0–0.2)

## 2021-06-12 LAB — FERRITIN: Ferritin: 272 ng/mL (ref 11–307)

## 2021-06-12 NOTE — Progress Notes (Signed)
Pt has no concerns

## 2021-06-16 NOTE — Progress Notes (Signed)
? ? ? ?Hematology/Oncology Consult note ?[facility]  ?[phone] [phone] ? ?Patient Care Team: ?Tracie Harrier, MD as PCP - General (Internal Medicine)  ? ?Name of the patient: Kristen Mcmahon  ?[id number]  ?Mar 02, 1970  ? ?Date of visit: 06/16/21 ? ?Diagnosis-leukocytosis and thrombocytosis likely reactive ? ?Chief complaint/ Reason for visit-routine follow-up of leukocytosis and thrombocytosis ? ?Heme/Onc history: Patient is a 51 year old female previously followed by Dr. Mike Gip for leukocytosis and thrombocytosis.  She has had stable white cell count ranging between 11-16 over the last 5 to 6 years.  JAK2 mutation testing, BCR ABL testing negative.  CAL R and MPL mutation testing negative.  She also has mild thrombocytosis with a platelet count that usually fluctuates in the 400s.  History of mild iron deficiency anemia for which she has been on oral iron. ?  ? ?Interval history-patient is doing well overall.  Denies any specific complaints at this time. ? ?ECOG PS- 0 ?Pain scale- 0 ? ? ?Review of systems- Review of Systems  ?Constitutional:  Negative for chills, fever, malaise/fatigue and weight loss.  ?HENT:  Negative for congestion, ear discharge and nosebleeds.   ?Eyes:  Negative for blurred vision.  ?Respiratory:  Negative for cough, hemoptysis, sputum production, shortness of breath and wheezing.   ?Cardiovascular:  Negative for chest pain, palpitations, orthopnea and claudication.  ?Gastrointestinal:  Negative for abdominal pain, blood in stool, constipation, diarrhea, heartburn, melena, nausea and vomiting.  ?Genitourinary:  Negative for dysuria, flank pain, frequency, hematuria and urgency.  ?Musculoskeletal:  Negative for back pain, joint pain and myalgias.  ?Skin:  Negative for rash.  ?Neurological:  Negative for dizziness, tingling, focal weakness, seizures, weakness and headaches.  ?Endo/Heme/Allergies:  Does not bruise/bleed easily.   ?Psychiatric/Behavioral:  Negative for depression and suicidal ideas. The patient does not have insomnia.    ? ? ?Allergies  ?Allergen Reactions  ? Sulfa Antibiotics Rash  ? Tetracycline Other (See Comments)  ?  Caused migraines  ? ? ? ?Past Medical History:  ?Diagnosis Date  ? Anemia   ? Anxiety   ? Aortic valve disorder   ? Asthma   ? COPD (chronic obstructive pulmonary disease) ([place])   ? Dysuria   ? FOP (fibrodysplasia ossificans progressiva)   ? GERD (gastroesophageal reflux disease)   ? Gross hematuria   ? HLD (hyperlipidemia)   ? Hypertension   ? Kidney stones   ? Migraines   ? Reflux   ? Rhinitis   ? UTI (lower urinary tract infection)   ? ? ? ?Past Surgical History:  ?Procedure Laterality Date  ? ABDOMINAL HYSTERECTOMY    ? COLONOSCOPY WITH PROPOFOL N/A 12/10/2020  ? Procedure: COLONOSCOPY WITH PROPOFOL;  Surgeon: Annamaria Helling, DO;  Location: [facility] ENDOSCOPY;  Service: Gastroenterology;  Laterality: N/A;  ? EYE SURGERY Bilateral 09/08/2019  ? cataract extraction  ? Hallux rigidus of left foot  02/04/2012  ? TUBAL LIGATION    ? ? ?Social History  ? ?Socioeconomic History  ? Marital status: Married  ?  Spouse name: Not on file  ? Number of children: Not on file  ? Years of education: Not on file  ? Highest education level: Not on file  ?Occupational History  ? Not on file  ?Tobacco Use  ? Smoking status: Former  ?  Types: Cigarettes  ?  Quit date: 1995  ?  Years since quitting: 28.3  ? Smokeless tobacco: Never  ? Tobacco comments:  ?  Quit 25 Years  ?  Vaping Use  ? Vaping Use: Never used  ?Substance and Sexual Activity  ? Alcohol use: Yes  ?  Alcohol/week: 0.0 standard drinks  ?  Comment: occasional  ? Drug use: No  ? Sexual activity: Yes  ?Other Topics Concern  ? Not on file  ?Social History Narrative  ? Not on file  ? ?Social Determinants of Health  ? ?Financial Resource Strain: Not on file  ?Food Insecurity: Not on file  ?Transportation Needs: Not on file  ?Physical Activity: Not on file  ?Stress:  Not on file  ?Social Connections: Not on file  ?Intimate Partner Violence: Not on file  ? ? ?Family History  ?Problem Relation Age of Onset  ? Bladder Cancer Father   ? Diabetes Mellitus II Father   ? Prostate cancer Father   ? Hypertension Father   ? Leukemia Paternal Grandmother   ? Kidney disease Neg Hx   ? Kidney cancer Neg Hx   ? Breast cancer Neg Hx   ? ? ? ?Current Outpatient Medications:  ?  aspirin EC 81 MG tablet, Take 81 mg by mouth daily., Disp: , Rfl:  ?  baclofen (LIORESAL) 20 MG tablet, Take 20 mg by mouth 3 (three) times daily. , Disp: , Rfl:  ?  COMBIVENT RESPIMAT 20-100 MCG/ACT AERS respimat, SMARTSIG:2 Inhalation Via Inhaler 4 Times Daily, Disp: , Rfl:  ?  Fluticasone-Salmeterol (ADVAIR) 250-50 MCG/DOSE AEPB, 1 puff bid, Disp: , Rfl:  ?  lamoTRIgine (LAMICTAL) 200 MG tablet, Take 400 mg by mouth daily. , Disp: , Rfl:  ?  losartan-hydrochlorothiazide (HYZAAR) 50-12.5 MG tablet, Take 1 tablet by mouth daily. , Disp: , Rfl:  ?  montelukast (SINGULAIR) 10 MG tablet, Take 10 mg by mouth at bedtime., Disp: , Rfl:  ?  Omega-3 1000 MG CAPS, Take 1 capsule by mouth daily. , Disp: , Rfl:  ?  simvastatin (ZOCOR) 20 MG tablet, Take 20 mg by mouth daily., Disp: , Rfl:  ?  SUMAtriptan (IMITREX) 100 MG tablet, Take 100 mg by mouth as needed. , Disp: , Rfl:  ?  Vitamin D, Ergocalciferol, (DRISDOL) 50000 UNITS CAPS capsule, Take 50,000 Units by mouth every 7 (seven) days. , Disp: , Rfl:  ? ?Physical exam:  ?Vitals:  ? 06/12/21 1327  ?BP: 123/85  ?Pulse: 91  ?Resp: 16  ?Temp: 98.2 ?F (36.8 ?C)  ?TempSrc: Oral  ?Weight: 230 lb (104.3 kg)  ?Height: '5\' 4"'$  (1.626 m)  ? ?Physical Exam ?Constitutional:   ?   General: She is not in acute distress. ?Cardiovascular:  ?   Rate and Rhythm: Normal rate and regular rhythm.  ?   Heart sounds: Normal heart sounds.  ?Pulmonary:  ?   Effort: Pulmonary effort is normal.  ?   Breath sounds: Normal breath sounds.  ?Skin: ?   General: Skin is warm and dry.  ?Neurological:  ?   Mental  Status: She is alert and oriented to person, place, and time.  ?  ? ? ?  Latest Ref Rng & Units 06/12/2021  ?  1:14 PM  ?CMP  ?Glucose 70 - 99 mg/dL 106    ?BUN 6 - 20 mg/dL 16    ?Creatinine 0.44 - 1.00 mg/dL 0.61    ?Sodium 135 - 145 mmol/L 134    ?Potassium 3.5 - 5.1 mmol/L 4.0    ?Chloride 98 - 111 mmol/L 100    ?CO2 22 - 32 mmol/L 25    ?Calcium 8.9 - 10.3 mg/dL 9.9    ?  Total Protein 6.5 - 8.1 g/dL 8.5    ?Total Bilirubin 0.3 - 1.2 mg/dL 0.9    ?Alkaline Phos 38 - 126 U/L 85    ?AST 15 - 41 U/L 34    ?ALT 0 - 44 U/L 37    ? ? ?  Latest Ref Rng & Units 06/12/2021  ?  1:14 PM  ?CBC  ?WBC 4.0 - 10.5 K/uL 13.2    ?Hemoglobin 12.0 - 15.0 g/dL 13.5    ?Hematocrit 36.0 - 46.0 % 40.6    ?Platelets 150 - 400 K/uL 443    ? ? ? ?Assessment and plan- Patient is a 51 y.o. female here for routine follow-up of leukocytosis and thrombocytosis ? ?Patient has had chronic leukocytosis with a white cell count that fluctuates between 10-16 at least for the last 8 years.  Differential mainly shows neutrophilia.  There is no consistent rise in her white cell count.  Platelets have also been between 400s to 500s.  She has had a work-up including JAK2, CALR and MPL mutations in the past which have all been negative.  Again no clear rising trend in her platelet count.  Given the stability in her counts I do not think the patient requires follow-up with me at this time.  She can continue to follow-up with Dr. Ginette Pitman and if there is a continued increase in her white cell count and/or platelet count she can be referred to [place] in the future ?  ?Visit Diagnosis ?1. Thrombocytosis   ?2. Leukocytosis, unspecified type   ? ? ? ?Dr. Randa Evens, MD, MPH ?[facility] ?[phone] ?06/16/2021 ?8:08 AM ? ? ? ? ? ? ?    ? ? ? ? ? ?

## 2022-08-18 ENCOUNTER — Other Ambulatory Visit: Payer: Self-pay | Admitting: Obstetrics and Gynecology

## 2022-08-18 DIAGNOSIS — Z1231 Encounter for screening mammogram for malignant neoplasm of breast: Secondary | ICD-10-CM

## 2022-12-09 ENCOUNTER — Inpatient Hospital Stay: Admission: RE | Admit: 2022-12-09 | Payer: 59 | Source: Ambulatory Visit

## 2023-02-10 ENCOUNTER — Ambulatory Visit
Admission: RE | Admit: 2023-02-10 | Discharge: 2023-02-10 | Disposition: A | Discharge status: Home / Self Care | Payer: Managed Care, Other (non HMO) | Source: Ambulatory Visit | Attending: Obstetrics and Gynecology | Admitting: Obstetrics and Gynecology

## 2023-02-10 DIAGNOSIS — Z1231 Encounter for screening mammogram for malignant neoplasm of breast: Secondary | ICD-10-CM | POA: Diagnosis present

## 2023-02-12 ENCOUNTER — Other Ambulatory Visit: Payer: Self-pay | Admitting: Obstetrics and Gynecology

## 2023-02-12 DIAGNOSIS — R928 Other abnormal and inconclusive findings on diagnostic imaging of breast: Secondary | ICD-10-CM

## 2023-02-19 ENCOUNTER — Ambulatory Visit
Admission: RE | Admit: 2023-02-19 | Discharge: 2023-02-19 | Disposition: A | Discharge status: Home / Self Care | Payer: Managed Care, Other (non HMO) | Source: Ambulatory Visit | Attending: Obstetrics and Gynecology | Admitting: Obstetrics and Gynecology

## 2023-02-19 DIAGNOSIS — R928 Other abnormal and inconclusive findings on diagnostic imaging of breast: Secondary | ICD-10-CM

## 2023-04-26 ENCOUNTER — Ambulatory Visit
Admission: RE | Admit: 2023-04-26 | Discharge: 2023-04-26 | Disposition: A | Discharge status: Home / Self Care | Source: Ambulatory Visit | Attending: Emergency Medicine | Admitting: Emergency Medicine

## 2023-04-26 VITALS — BP 129/79 | HR 106 | Temp 98.6°F | Resp 14 | Ht 64.0 in | Wt 229.9 lb

## 2023-04-26 DIAGNOSIS — K121 Other forms of stomatitis: Secondary | ICD-10-CM | POA: Insufficient documentation

## 2023-04-26 DIAGNOSIS — H6593 Unspecified nonsuppurative otitis media, bilateral: Secondary | ICD-10-CM | POA: Insufficient documentation

## 2023-04-26 DIAGNOSIS — R52 Pain, unspecified: Secondary | ICD-10-CM | POA: Insufficient documentation

## 2023-04-26 DIAGNOSIS — J09X2 Influenza due to identified novel influenza A virus with other respiratory manifestations: Secondary | ICD-10-CM | POA: Diagnosis present

## 2023-04-26 LAB — RESP PANEL BY RT-PCR (FLU A&B, COVID) ARPGX2
Influenza A by PCR: POSITIVE — AB
Influenza B by PCR: NEGATIVE
SARS Coronavirus 2 by RT PCR: NEGATIVE

## 2023-04-26 MED ORDER — XOFLUZA (80 MG DOSE) 1 X 80 MG PO TBPK: 1.0000 | ORAL_TABLET | Freq: Once | ORAL | 0 refills | Status: DC

## 2023-04-26 MED ORDER — OSELTAMIVIR PHOSPHATE 75 MG PO CAPS: 75.0000 mg | ORAL_CAPSULE | Freq: Two times a day (BID) | ORAL | 0 refills | Status: DC

## 2023-04-26 MED ORDER — XOFLUZA (80 MG DOSE) 1 X 80 MG PO TBPK: 1.0000 | ORAL_TABLET | Freq: Once | ORAL | 0 refills | Status: AC

## 2023-04-26 MED ORDER — FLUTICASONE PROPIONATE 50 MCG/ACT NA SUSP: 1.0000 | Freq: Two times a day (BID) | NASAL | 0 refills | Status: AC

## 2023-04-26 MED ORDER — OSELTAMIVIR PHOSPHATE 75 MG PO CAPS: 75.0000 mg | ORAL_CAPSULE | Freq: Two times a day (BID) | ORAL | 0 refills | Status: AC

## 2023-04-26 NOTE — ED Provider Notes (Signed)
 [facility]    CSN: [id number] Arrival date & time: 04/26/23  1309      History   Chief Complaint Chief Complaint  Patient presents with   Nasal Congestion    Appointment   Cough    HPI Kristen Mcmahon is a 53 y.o. female.   Pleasant 53 year old female presents today due to concerns of the flu.  States her husband was diagnosed with the flu 3 days ago.  Her symptoms started primarily yesterday including nasal congestion, cough, headache, chills, body aches, fatigue, sore throat.  She denies a known fever however she states she has been alternating Tylenol and ibuprofen.  She denies nausea or vomiting.  No shortness of breath or chest pain. She does endorse some pressure in her ears.    Cough   Past Medical History:  Diagnosis Date   Anemia    Anxiety    Aortic valve disorder    Asthma    COPD (chronic obstructive pulmonary disease) (HCC)    Dysuria    FOP (fibrodysplasia ossificans progressiva)    GERD (gastroesophageal reflux disease)    Gross hematuria    HLD (hyperlipidemia)    Hypertension    Kidney stones    Migraines    Reflux    Rhinitis    UTI (lower urinary tract infection)     Patient Active Problem List   Diagnosis Date Noted   Thrombocytosis 07/07/2018   Leukocytosis 07/07/2018   Autoantibody titer elevated 12/14/2014   Asthma without status asthmaticus 12/14/2014   Acid reflux 12/14/2014   Breath shortness 12/14/2014   Microscopic hematuria 11/22/2014   UPJ (ureteropelvic junction) obstruction 11/22/2014   Vaginal yeast infection 11/22/2014   Dysuria 11/01/2014   History of hematuria 11/01/2014   HLD (hyperlipidemia) 05/23/2014   Avitaminosis D 05/23/2014   Headache, migraine 04/27/2014   Pulmonary hypertension (HCC) 03/14/2011   Fast heart beat 03/14/2011    Past Surgical History:  Procedure Laterality Date   ABDOMINAL HYSTERECTOMY     COLONOSCOPY WITH PROPOFOL N/A 12/10/2020   Procedure: COLONOSCOPY WITH PROPOFOL;   Surgeon: Jaynie Collins, DO;  Location: [facility] ENDOSCOPY;  Service: Gastroenterology;  Laterality: N/A;   EYE SURGERY Bilateral 09/08/2019   cataract extraction   Hallux rigidus of left foot  02/04/2012   TUBAL LIGATION      OB History   No obstetric history on file.      Home Medications    Prior to Admission medications   Medication Sig Start Date End Date Taking? Authorizing Provider  aspirin EC 81 MG tablet Take 81 mg by mouth daily.   Yes [provider]  baclofen (LIORESAL) 20 MG tablet Take 20 mg by mouth 3 (three) times daily.  06/18/18  Yes [provider]  COMBIVENT RESPIMAT 20-100 MCG/ACT AERS respimat SMARTSIG:2 Inhalation Via Inhaler 4 Times Daily 11/05/20  Yes [provider]  fluticasone (FLONASE) 50 MCG/ACT nasal spray Place 1 spray into both nostrils in the morning and at bedtime. 04/26/23  Yes Tawonna Esquer L, PA  Fluticasone-Salmeterol (ADVAIR) 250-50 MCG/DOSE AEPB 1 puff bid 04/27/14  Yes [provider]  lamoTRIgine (LAMICTAL) 200 MG tablet Take 400 mg by mouth daily.  04/27/14  Yes [provider]  losartan-hydrochlorothiazide (HYZAAR) 50-12.5 MG tablet Take 1 tablet by mouth daily.  10/30/14  Yes [provider]  montelukast (SINGULAIR) 10 MG tablet Take 10 mg by mouth at bedtime. 07/04/20 04/26/23 Yes [provider]  Omega-3 1000 MG CAPS Take  1 capsule by mouth daily.    Yes [provider]  simvastatin (ZOCOR) 20 MG tablet Take 20 mg by mouth daily.   Yes [provider]  SUMAtriptan (IMITREX) 100 MG tablet Take 100 mg by mouth as needed.  04/27/14  Yes [provider]  Vitamin D, Ergocalciferol, (DRISDOL) 50000 UNITS CAPS capsule Take 50,000 Units by mouth every 7 (seven) days.  04/27/14  Yes [provider]  Baloxavir Marboxil,80 MG Dose, (XOFLUZA, 80 MG DOSE,) 1 x 80 MG TBPK Take 1 tablet by mouth once for 1 dose. 04/26/23 04/26/23  Guy Sandifer L, PA  oseltamivir  (TAMIFLU) 75 MG capsule Take 1 capsule (75 mg total) by mouth every 12 (twelve) hours for 5 days. 04/26/23 05/01/23  Maretta Bees, PA    Family History Family History  Problem Relation Age of Onset   Bladder Cancer Father    Diabetes Mellitus II Father    Prostate cancer Father    Hypertension Father    Leukemia Paternal Grandmother    Kidney disease Neg Hx    Kidney cancer Neg Hx    Breast cancer Neg Hx     Social History Social History   Tobacco Use   Smoking status: Former    Current packs/day: 0.00    Types: Cigarettes    Quit date: 1995    Years since quitting: 30.2   Smokeless tobacco: Never   Tobacco comments:    Quit 25 Years  Vaping Use   Vaping status: Never Used  Substance Use Topics   Alcohol use: Yes    Alcohol/week: 0.0 standard drinks of alcohol    Comment: occasional   Drug use: No     Allergies   Sulfa antibiotics and Tetracycline   Review of Systems Review of Systems  Respiratory:  Positive for cough.      Physical Exam Triage Vital Signs ED Triage Vitals  Encounter Vitals Group     BP 04/26/23 1334 129/79     Systolic BP Percentile --      Diastolic BP Percentile --      Pulse Rate 04/26/23 1334 (!) 106     Resp 04/26/23 1334 14     Temp 04/26/23 1334 98.6 F (37 C)     Temp Source 04/26/23 1334 Oral     SpO2 04/26/23 1334 97 %     Weight 04/26/23 1332 229 lb 15 oz (104.3 kg)     Height 04/26/23 1332 5\' 4"  (1.626 m)     Head Circumference --      Peak Flow --      Pain Score 04/26/23 1331 3     Pain Loc --      Pain Education --      Exclude from Growth Chart --    No data found.  Updated Vital Signs BP 129/79 (BP Location: Left Arm)   Pulse (!) 106   Temp 98.6 F (37 C) (Oral)   Resp 14   Ht 5\' 4"  (1.626 m)   Wt 229 lb 15 oz (104.3 kg)   SpO2 97%   BMI 39.47 kg/m   Visual Acuity Right Eye Distance:   Left Eye Distance:   Bilateral Distance:    Right Eye Near:   Left Eye Near:    Bilateral Near:      Physical Exam Vitals and nursing note reviewed. Exam conducted with a chaperone present.  Constitutional:      General: She is not in acute  distress.    Appearance: Normal appearance. She is obese. She is ill-appearing. She is not toxic-appearing or diaphoretic.  HENT:     Head: Normocephalic and atraumatic.     Right Ear: A middle ear effusion is present. Tympanic membrane is not injected or erythematous.     Left Ear: A middle ear effusion is present. Tympanic membrane is not injected or erythematous.     Nose: Nose normal. No mucosal edema or congestion.     Right Sinus: No maxillary sinus tenderness or frontal sinus tenderness.     Left Sinus: No maxillary sinus tenderness or frontal sinus tenderness.     Mouth/Throat:     Lips: Pink.     Mouth: Mucous membranes are moist.     Palate: Lesions (single ulceration to R of uvula) present.     Pharynx: Oropharynx is clear. Uvula midline. No pharyngeal swelling, oropharyngeal exudate, posterior oropharyngeal erythema or uvula swelling.  Eyes:     General: No scleral icterus.       Right eye: No discharge.        Left eye: No discharge.  Cardiovascular:     Rate and Rhythm: Regular rhythm. Tachycardia present.     Heart sounds: No murmur heard. Pulmonary:     Effort: Pulmonary effort is normal. No respiratory distress.     Breath sounds: Normal breath sounds. No stridor. No wheezing or rhonchi.  Chest:     Chest wall: No tenderness.  Musculoskeletal:     Cervical back: Normal range of motion and neck supple. No tenderness.  Lymphadenopathy:     Cervical: No cervical adenopathy.  Skin:    General: Skin is warm and dry.     Coloration: Skin is not jaundiced.     Findings: No bruising, erythema or rash.  Neurological:     General: No focal deficit present.     Mental Status: She is alert and oriented to person, place, and time.     Motor: No weakness.     Gait: Gait normal.      UC Treatments / Results  Labs (all labs  ordered are listed, but only abnormal results are displayed) Labs Reviewed  RESP PANEL BY RT-PCR (FLU A&B, COVID) ARPGX2 - Abnormal; Notable for the following components:      Result Value   Influenza A by PCR POSITIVE (*)    All other components within normal limits    EKG   Radiology No results found.  Procedures Procedures (including critical care time)  Medications Ordered in UC Medications - No data to display  Initial Impression / Assessment and Plan / UC Course  I have reviewed the triage vital signs and the nursing notes.  Pertinent labs & imaging results that were available during my care of the patient were reviewed by me and considered in my medical decision making (see chart for details).     Influenza A - pt tested positive after known exposure. Will treat with antiviral medication as pt within treatment window. Will try to get Xofluza covered, but if not tamiflu given as an alternative. Body aches - OTC oscillococcinum Oral ulceration - supportive care, secondary to #1 Middle ear effusion - flonase PRN    Final Clinical Impressions(s) / UC Diagnoses   Final diagnoses:  Influenza due to identified novel influenza A virus with other respiratory manifestations  Body aches  Oral ulceration  Middle ear effusion, bilateral     Discharge Instructions      You  are positive for influenza A. Please start taking the treatment as soon as possible. (I have prescribed you both xofluza and tamiflu - please have the pharmacy try to run the xofluza first as this is a superior medication. If covered, you will take one tablet x 1 dose. If you cannot get this Rx covered, then fill the tamiflu and take this twice daily x 5 days) Only fill / take ONE rx.  This will help prevent worsening symptoms, and shorten the course of the flu. Please alternate Tylenol and ibuprofen as needed for aches and fever. Rest.  Drink plenty of water. Try OTC oscillococcinum to help with body  aches and fatigue. Use the flonase to help with your ear fluid - one spray twice daily as needed.  You are considered contagious until 24 hours after your fever breaks.     ED Prescriptions     Medication Sig Dispense Auth. Provider   Baloxavir Marboxil,80 MG Dose, (XOFLUZA, 80 MG DOSE,) 1 x 80 MG TBPK  (Status: Discontinued) Take 1 tablet by mouth once for 1 dose. 1 each Myron Stankovich L, PA   oseltamivir (TAMIFLU) 75 MG capsule  (Status: Discontinued) Take 1 capsule (75 mg total) by mouth every 12 (twelve) hours for 5 days. 10 capsule Mikyla Schachter L, PA   fluticasone (FLONASE) 50 MCG/ACT nasal spray Place 1 spray into both nostrils in the morning and at bedtime. 16 mL Capri Veals L, PA   Baloxavir Marboxil,80 MG Dose, (XOFLUZA, 80 MG DOSE,) 1 x 80 MG TBPK Take 1 tablet by mouth once for 1 dose. 1 each Emanuele Mcwhirter L, PA   oseltamivir (TAMIFLU) 75 MG capsule Take 1 capsule (75 mg total) by mouth every 12 (twelve) hours for 5 days. 10 capsule Gordon Carlson L, PA      PDMP not reviewed this encounter.   Maretta Bees, [place] 04/26/23 [phone]

## 2023-04-26 NOTE — Discharge Instructions (Addendum)
 You are positive for influenza A. Please start taking the treatment as soon as possible. (I have prescribed you both xofluza and tamiflu - please have the pharmacy try to run the xofluza first as this is a superior medication. If covered, you will take one tablet x 1 dose. If you cannot get this Rx covered, then fill the tamiflu and take this twice daily x 5 days) Only fill / take ONE rx.  This will help prevent worsening symptoms, and shorten the course of the flu. Please alternate Tylenol and ibuprofen as needed for aches and fever. Rest.  Drink plenty of water. Try OTC oscillococcinum to help with body aches and fatigue. Use the flonase to help with your ear fluid - one spray twice daily as needed.  You are considered contagious until 24 hours after your fever breaks.

## 2023-04-26 NOTE — ED Triage Notes (Signed)
 Patient c/o cough, nasal congestion, bodyaches and headache that started yesterday.  Patient states that her husband was diagnosed with the flu earlier last week.
